# Patient Record
Sex: Female | Born: 1960 | Race: Black or African American | Hispanic: No | Marital: Single | Smoking: Never smoker
Health system: Southern US, Community
[De-identification: ages and names within clinical notes are randomized; demographics above are authoritative.]

## PROBLEM LIST (undated history)

## (undated) DIAGNOSIS — R739 Hyperglycemia, unspecified: Secondary | ICD-10-CM

## (undated) DIAGNOSIS — E669 Obesity, unspecified: Secondary | ICD-10-CM

## (undated) DIAGNOSIS — J329 Chronic sinusitis, unspecified: Secondary | ICD-10-CM

## (undated) DIAGNOSIS — J309 Allergic rhinitis, unspecified: Secondary | ICD-10-CM

## (undated) DIAGNOSIS — R3129 Other microscopic hematuria: Secondary | ICD-10-CM

## (undated) DIAGNOSIS — J45909 Unspecified asthma, uncomplicated: Secondary | ICD-10-CM

## (undated) DIAGNOSIS — F32A Depression, unspecified: Secondary | ICD-10-CM

## (undated) DIAGNOSIS — N6001 Solitary cyst of right breast: Secondary | ICD-10-CM

## (undated) DIAGNOSIS — T7840XA Allergy, unspecified, initial encounter: Secondary | ICD-10-CM

## (undated) DIAGNOSIS — B351 Tinea unguium: Secondary | ICD-10-CM

## (undated) DIAGNOSIS — M545 Low back pain, unspecified: Secondary | ICD-10-CM

## (undated) DIAGNOSIS — E039 Hypothyroidism, unspecified: Secondary | ICD-10-CM

## (undated) DIAGNOSIS — K219 Gastro-esophageal reflux disease without esophagitis: Secondary | ICD-10-CM

## (undated) DIAGNOSIS — D259 Leiomyoma of uterus, unspecified: Secondary | ICD-10-CM

## (undated) HISTORY — DX: Allergy, unspecified, initial encounter: T78.40XA

## (undated) HISTORY — DX: Other microscopic hematuria: R31.29

## (undated) HISTORY — DX: Depression, unspecified: F32.A

## (undated) HISTORY — DX: Low back pain, unspecified: M54.50

## (undated) HISTORY — DX: Chronic sinusitis, unspecified: J32.9

## (undated) HISTORY — PX: TONSILLECTOMY: SUR1361

## (undated) HISTORY — DX: Unspecified asthma, uncomplicated: J45.909

## (undated) HISTORY — DX: Gastro-esophageal reflux disease without esophagitis: K21.9

## (undated) HISTORY — DX: Leiomyoma of uterus, unspecified: D25.9

## (undated) HISTORY — DX: Obesity, unspecified: E66.9

## (undated) HISTORY — DX: Hypothyroidism, unspecified: E03.9

## (undated) HISTORY — PX: HERNIA REPAIR: SHX51

## (undated) HISTORY — DX: Solitary cyst of right breast: N60.01

## (undated) HISTORY — DX: Tinea unguium: B35.1

## (undated) HISTORY — DX: Hyperglycemia, unspecified: R73.9

## (undated) HISTORY — DX: Allergic rhinitis, unspecified: J30.9

---

## 1998-08-21 ENCOUNTER — Emergency Department (HOSPITAL_COMMUNITY): Admission: EM | Admit: 1998-08-21 | Discharge: 1998-08-21 | Payer: Self-pay | Admitting: Emergency Medicine

## 1998-08-21 ENCOUNTER — Encounter: Payer: Self-pay | Admitting: Emergency Medicine

## 1998-12-23 ENCOUNTER — Emergency Department (HOSPITAL_COMMUNITY): Admission: EM | Admit: 1998-12-23 | Discharge: 1998-12-23 | Payer: Self-pay | Admitting: Emergency Medicine

## 1999-01-14 ENCOUNTER — Emergency Department (HOSPITAL_COMMUNITY): Admission: EM | Admit: 1999-01-14 | Discharge: 1999-01-14 | Payer: Self-pay | Admitting: Emergency Medicine

## 1999-01-14 ENCOUNTER — Encounter: Payer: Self-pay | Admitting: Emergency Medicine

## 2000-05-08 ENCOUNTER — Emergency Department (HOSPITAL_COMMUNITY): Admission: EM | Admit: 2000-05-08 | Discharge: 2000-05-08 | Payer: Self-pay | Admitting: *Deleted

## 2000-05-09 ENCOUNTER — Emergency Department (HOSPITAL_COMMUNITY): Admission: EM | Admit: 2000-05-09 | Discharge: 2000-05-09 | Payer: Self-pay | Admitting: Emergency Medicine

## 2000-12-18 ENCOUNTER — Ambulatory Visit (HOSPITAL_COMMUNITY): Admission: RE | Admit: 2000-12-18 | Discharge: 2000-12-18 | Payer: Self-pay | Admitting: Internal Medicine

## 2000-12-18 ENCOUNTER — Encounter: Payer: Self-pay | Admitting: Internal Medicine

## 2001-09-03 ENCOUNTER — Other Ambulatory Visit: Admission: RE | Admit: 2001-09-03 | Discharge: 2001-09-03 | Payer: Self-pay | Admitting: Obstetrics and Gynecology

## 2002-07-13 ENCOUNTER — Encounter: Admission: RE | Admit: 2002-07-13 | Discharge: 2002-10-11 | Payer: Self-pay | Admitting: Internal Medicine

## 2002-09-17 ENCOUNTER — Other Ambulatory Visit: Admission: RE | Admit: 2002-09-17 | Discharge: 2002-09-17 | Payer: Self-pay | Admitting: Obstetrics and Gynecology

## 2002-09-30 ENCOUNTER — Other Ambulatory Visit: Admission: RE | Admit: 2002-09-30 | Discharge: 2002-09-30 | Payer: Self-pay | Admitting: Obstetrics and Gynecology

## 2002-10-07 ENCOUNTER — Encounter: Payer: Self-pay | Admitting: Internal Medicine

## 2002-10-07 ENCOUNTER — Ambulatory Visit (HOSPITAL_COMMUNITY): Admission: RE | Admit: 2002-10-07 | Discharge: 2002-10-07 | Payer: Self-pay | Admitting: Internal Medicine

## 2002-10-12 ENCOUNTER — Encounter: Admission: RE | Admit: 2002-10-12 | Discharge: 2002-10-12 | Payer: Self-pay | Admitting: Internal Medicine

## 2002-10-12 ENCOUNTER — Encounter: Payer: Self-pay | Admitting: Internal Medicine

## 2003-10-22 ENCOUNTER — Emergency Department (HOSPITAL_COMMUNITY): Admission: AD | Admit: 2003-10-22 | Discharge: 2003-10-22 | Payer: Self-pay | Admitting: Family Medicine

## 2003-12-01 ENCOUNTER — Encounter: Admission: RE | Admit: 2003-12-01 | Discharge: 2003-12-01 | Payer: Self-pay | Admitting: *Deleted

## 2004-01-10 ENCOUNTER — Encounter: Admission: RE | Admit: 2004-01-10 | Discharge: 2004-01-10 | Payer: Self-pay | Admitting: Internal Medicine

## 2004-09-12 ENCOUNTER — Ambulatory Visit: Payer: Self-pay | Admitting: Gastroenterology

## 2004-09-19 ENCOUNTER — Ambulatory Visit: Payer: Self-pay | Admitting: Gastroenterology

## 2004-10-09 ENCOUNTER — Ambulatory Visit: Payer: Self-pay | Admitting: Gastroenterology

## 2004-10-10 ENCOUNTER — Ambulatory Visit (HOSPITAL_COMMUNITY): Admission: RE | Admit: 2004-10-10 | Discharge: 2004-10-10 | Payer: Self-pay | Admitting: Gastroenterology

## 2004-10-31 ENCOUNTER — Ambulatory Visit: Payer: Self-pay | Admitting: Gastroenterology

## 2006-02-05 ENCOUNTER — Encounter: Admission: RE | Admit: 2006-02-05 | Discharge: 2006-02-05 | Payer: Self-pay | Admitting: Internal Medicine

## 2007-04-18 ENCOUNTER — Emergency Department (HOSPITAL_COMMUNITY): Admission: EM | Admit: 2007-04-18 | Discharge: 2007-04-18 | Payer: Self-pay | Admitting: Emergency Medicine

## 2007-04-24 ENCOUNTER — Encounter: Admission: RE | Admit: 2007-04-24 | Discharge: 2007-05-15 | Payer: Self-pay | Admitting: Internal Medicine

## 2007-09-09 ENCOUNTER — Ambulatory Visit (HOSPITAL_COMMUNITY): Admission: RE | Admit: 2007-09-09 | Discharge: 2007-09-09 | Payer: Self-pay | Admitting: Internal Medicine

## 2009-05-18 ENCOUNTER — Ambulatory Visit (HOSPITAL_COMMUNITY): Admission: RE | Admit: 2009-05-18 | Discharge: 2009-05-18 | Payer: Self-pay | Admitting: Internal Medicine

## 2009-08-19 ENCOUNTER — Encounter (INDEPENDENT_AMBULATORY_CARE_PROVIDER_SITE_OTHER): Payer: Self-pay | Admitting: *Deleted

## 2010-01-23 ENCOUNTER — Encounter (INDEPENDENT_AMBULATORY_CARE_PROVIDER_SITE_OTHER): Payer: Self-pay | Admitting: *Deleted

## 2010-04-19 ENCOUNTER — Telehealth: Payer: Self-pay | Admitting: Gastroenterology

## 2010-05-22 ENCOUNTER — Encounter (INDEPENDENT_AMBULATORY_CARE_PROVIDER_SITE_OTHER): Payer: Self-pay | Admitting: *Deleted

## 2010-06-02 ENCOUNTER — Other Ambulatory Visit: Admission: RE | Admit: 2010-06-02 | Discharge: 2010-06-02 | Payer: Self-pay | Admitting: Obstetrics and Gynecology

## 2010-06-02 ENCOUNTER — Other Ambulatory Visit
Admission: RE | Admit: 2010-06-02 | Discharge: 2010-06-02 | Payer: Self-pay | Source: Home / Self Care | Admitting: Obstetrics and Gynecology

## 2010-08-03 ENCOUNTER — Other Ambulatory Visit (HOSPITAL_COMMUNITY): Payer: Self-pay | Admitting: Obstetrics and Gynecology

## 2010-08-03 DIAGNOSIS — Z0000 Encounter for general adult medical examination without abnormal findings: Secondary | ICD-10-CM

## 2010-08-05 ENCOUNTER — Encounter: Payer: Self-pay | Admitting: Internal Medicine

## 2010-08-06 ENCOUNTER — Encounter: Payer: Self-pay | Admitting: Internal Medicine

## 2010-08-07 ENCOUNTER — Encounter: Payer: Self-pay | Admitting: Obstetrics and Gynecology

## 2010-08-15 NOTE — Progress Notes (Signed)
Summary: Schedule Colonoscopy  Phone Note Outgoing Call Call back at Home Phone [phone]   Call placed by: Harlow Mares CMA Duncan Dull),  April 19, 2010 10:58 AM Call placed to: Patient Summary of Call: patient had her colonoscopy scheduled and cxed she is still due and for her colonoscopy. I have Left a message on patients machine to call back.  Initial call taken by: Harlow Mares CMA Duncan Dull),  April 19, 2010 10:59 AM  Follow-up for Phone Call        No Answer, we will mail the patient a letter top remind her she is over due for her colonoscopy Follow-up by: Harlow Mares CMA Duncan Dull),  April 28, 2010 11:18 AM

## 2010-08-15 NOTE — Letter (Signed)
Summary: Pre Visit Letter Revised  [facility]  [address] [place], [place] [address]   Phone: [phone]  Fax: [phone]        05/22/2010 MRN: [id number] [facility] [address] [place], [place]  [address]             Procedure Date:  06-28-10   Welcome to the Gastroenterology Division at [facility].    You are scheduled to see a nurse for your pre-procedure visit on 06-12-10 at 3:30p.m. on the 3rd floor at [facility], [address].  We ask that you try to arrive at our office 15 minutes prior to your appointment time to allow for check-in.  Please take a minute to review the attached form.  If you answer "Yes" to one or more of the questions on the first page, we ask that you call the person listed at your earliest opportunity.  If you answer "No" to all of the questions, please complete the rest of the form and bring it to your appointment.    Your nurse visit will consist of discussing your medical and surgical history, your immediate family medical history, and your medications.   If you are unable to list all of your medications on the form, please bring the medication bottles to your appointment and we will list them.  We will need to be aware of both prescribed and over the counter drugs.  We will need to know exact dosage information as well.    Please be prepared to read and sign documents such as consent forms, a financial agreement, and acknowledgement forms.  If necessary, and with your consent, a friend or relative is welcome to sit-in on the nurse visit with you.  Please bring your insurance card so that we may make a copy of it.  If your insurance requires a referral to see a specialist, please bring your referral form from your primary care physician.  No co-pay is required for this nurse visit.     If you cannot keep your appointment, please call [phone] to cancel or reschedule prior to your appointment date.  This allows  [place] the opportunity to schedule an appointment for another patient in need of care.    Thank you for choosing [facility] for your medical needs.  We appreciate the opportunity to care for you.  Please visit [place] at our website  to learn more about our practice.  Sincerely, The Gastroenterology Division

## 2010-08-15 NOTE — Letter (Signed)
Summary: Colonoscopy Letter  [facility]  [address] [place], [place] [address]   Phone: [phone]  Fax: [phone]      August 19, 2009 MRN: [id number]   [facility] [address] [place], [place]  [address]   Dear Ms. BLOXOM,   According to your medical record, it is time for you to schedule a Colonoscopy. The American Cancer Society recommends this procedure as a method to detect early colon cancer. Patients with a family history of colon cancer, or a personal history of colon polyps or inflammatory bowel disease are at increased risk.  This letter has beeen generated based on the recommendations made at the time of your procedure. If you feel that in your particular situation this may no longer apply, please contact our office.  Please call our office at [phone] to schedule this appointment or to update your records at your earliest convenience.  Thank you for cooperating with [place] to provide you with the very best care possible.   Sincerely,  Vania Rea. Jarold Motto, M.D.  [facility] Gastroenterology Division [phone]

## 2010-08-15 NOTE — Letter (Signed)
Summary: Previsit letter  [facility]  [address] [place], [place] [address]   Phone: [phone]  Fax: [phone]       01/23/2010 MRN: [id number]  [facility] [address] [place], [place]  [address]  Dear Paula Lee,  Welcome to the Gastroenterology Division at [facility].    You are scheduled to see a nurse for your pre-procedure visit on 03-28-10 at 8:00a.m. on the 3rd floor at [facility], [address].  We ask that you try to arrive at our office 15 minutes prior to your appointment time to allow for check-in.  Your nurse visit will consist of discussing your medical and surgical history, your immediate family medical history, and your medications.    Please bring a complete list of all your medications or, if you prefer, bring the medication bottles and we will list them.  We will need to be aware of both prescribed and over the counter drugs.  We will need to know exact dosage information as well.  If you are on blood thinners (Coumadin, Plavix, Aggrenox, Ticlid, etc.) please call our office today/prior to your appointment, as we need to consult with your physician about holding your medication.   Please be prepared to read and sign documents such as consent forms, a financial agreement, and acknowledgement forms.  If necessary, and with your consent, a friend or relative is welcome to sit-in on the nurse visit with you.  Please bring your insurance card so that we may make a copy of it.  If your insurance requires a referral to see a specialist, please bring your referral form from your primary care physician.  No co-pay is required for this nurse visit.     If you cannot keep your appointment, please call [phone] to cancel or reschedule prior to your appointment date.  This allows [place] the opportunity to schedule an appointment for another patient in need of care.    Thank you for choosing [facility] for your medical needs.   We appreciate the opportunity to care for you.  Please visit [place] at our website  to learn more about our practice.                     Sincerely.                                                                                                                   The Gastroenterology Division

## 2010-08-21 ENCOUNTER — Ambulatory Visit (HOSPITAL_COMMUNITY): Admission: RE | Admit: 2010-08-21 | Payer: Self-pay | Source: Home / Self Care | Admitting: Obstetrics and Gynecology

## 2010-08-21 ENCOUNTER — Ambulatory Visit (HOSPITAL_COMMUNITY)
Admission: RE | Admit: 2010-08-21 | Discharge: 2010-08-21 | Disposition: A | Discharge status: Home / Self Care | Payer: BC Managed Care – PPO | Source: Ambulatory Visit | Attending: Obstetrics and Gynecology | Admitting: Obstetrics and Gynecology

## 2010-08-21 DIAGNOSIS — Z1231 Encounter for screening mammogram for malignant neoplasm of breast: Secondary | ICD-10-CM | POA: Insufficient documentation

## 2010-08-21 DIAGNOSIS — Z0000 Encounter for general adult medical examination without abnormal findings: Secondary | ICD-10-CM

## 2011-10-15 ENCOUNTER — Encounter: Payer: Self-pay | Admitting: Gastroenterology

## 2012-01-08 ENCOUNTER — Other Ambulatory Visit (HOSPITAL_COMMUNITY): Payer: Self-pay | Admitting: Internal Medicine

## 2012-01-08 DIAGNOSIS — Z1231 Encounter for screening mammogram for malignant neoplasm of breast: Secondary | ICD-10-CM

## 2012-01-31 ENCOUNTER — Ambulatory Visit (HOSPITAL_COMMUNITY): Payer: BC Managed Care – PPO

## 2012-03-14 DIAGNOSIS — R87810 Cervical high risk human papillomavirus (HPV) DNA test positive: Secondary | ICD-10-CM | POA: Insufficient documentation

## 2012-03-14 DIAGNOSIS — D251 Intramural leiomyoma of uterus: Secondary | ICD-10-CM | POA: Insufficient documentation

## 2012-04-01 ENCOUNTER — Other Ambulatory Visit (HOSPITAL_COMMUNITY): Payer: Self-pay | Admitting: Internal Medicine

## 2012-04-01 DIAGNOSIS — Z1231 Encounter for screening mammogram for malignant neoplasm of breast: Secondary | ICD-10-CM

## 2012-05-28 ENCOUNTER — Ambulatory Visit (HOSPITAL_COMMUNITY)
Admission: RE | Admit: 2012-05-28 | Discharge: 2012-05-28 | Disposition: A | Discharge status: Home / Self Care | Payer: BC Managed Care – PPO | Source: Ambulatory Visit | Attending: Internal Medicine | Admitting: Internal Medicine

## 2012-05-28 DIAGNOSIS — Z1231 Encounter for screening mammogram for malignant neoplasm of breast: Secondary | ICD-10-CM

## 2012-06-02 ENCOUNTER — Other Ambulatory Visit: Payer: Self-pay | Admitting: Internal Medicine

## 2012-06-02 DIAGNOSIS — R928 Other abnormal and inconclusive findings on diagnostic imaging of breast: Secondary | ICD-10-CM

## 2012-06-09 ENCOUNTER — Ambulatory Visit
Admission: RE | Admit: 2012-06-09 | Discharge: 2012-06-09 | Disposition: A | Discharge status: Home / Self Care | Payer: BC Managed Care – PPO | Source: Ambulatory Visit | Attending: Internal Medicine | Admitting: Internal Medicine

## 2012-06-09 DIAGNOSIS — R928 Other abnormal and inconclusive findings on diagnostic imaging of breast: Secondary | ICD-10-CM

## 2013-03-25 ENCOUNTER — Other Ambulatory Visit (HOSPITAL_COMMUNITY): Payer: Self-pay | Admitting: Internal Medicine

## 2013-03-25 DIAGNOSIS — Z1231 Encounter for screening mammogram for malignant neoplasm of breast: Secondary | ICD-10-CM

## 2013-04-17 ENCOUNTER — Ambulatory Visit (HOSPITAL_COMMUNITY)
Admission: RE | Admit: 2013-04-17 | Discharge: 2013-04-17 | Disposition: A | Discharge status: Home / Self Care | Payer: BC Managed Care – PPO | Source: Ambulatory Visit | Attending: Internal Medicine | Admitting: Internal Medicine

## 2013-04-17 ENCOUNTER — Other Ambulatory Visit (HOSPITAL_COMMUNITY): Payer: Self-pay | Admitting: Internal Medicine

## 2013-04-17 DIAGNOSIS — Z0000 Encounter for general adult medical examination without abnormal findings: Secondary | ICD-10-CM

## 2013-04-17 DIAGNOSIS — Z1231 Encounter for screening mammogram for malignant neoplasm of breast: Secondary | ICD-10-CM

## 2013-05-25 DIAGNOSIS — Z0000 Encounter for general adult medical examination without abnormal findings: Secondary | ICD-10-CM | POA: Insufficient documentation

## 2013-05-25 DIAGNOSIS — N951 Menopausal and female climacteric states: Secondary | ICD-10-CM | POA: Insufficient documentation

## 2013-05-27 ENCOUNTER — Encounter: Payer: Self-pay | Admitting: Gastroenterology

## 2013-06-15 ENCOUNTER — Ambulatory Visit (HOSPITAL_COMMUNITY)
Admission: RE | Admit: 2013-06-15 | Discharge: 2013-06-15 | Disposition: A | Discharge status: Home / Self Care | Payer: BC Managed Care – PPO | Source: Ambulatory Visit | Attending: Internal Medicine | Admitting: Internal Medicine

## 2013-06-15 DIAGNOSIS — Z1231 Encounter for screening mammogram for malignant neoplasm of breast: Secondary | ICD-10-CM | POA: Insufficient documentation

## 2013-06-15 DIAGNOSIS — Z0000 Encounter for general adult medical examination without abnormal findings: Secondary | ICD-10-CM

## 2014-02-05 DIAGNOSIS — Z803 Family history of malignant neoplasm of breast: Secondary | ICD-10-CM | POA: Insufficient documentation

## 2014-05-21 DIAGNOSIS — N3943 Post-void dribbling: Secondary | ICD-10-CM | POA: Insufficient documentation

## 2014-05-25 ENCOUNTER — Other Ambulatory Visit: Payer: Self-pay

## 2014-05-25 DIAGNOSIS — Z1231 Encounter for screening mammogram for malignant neoplasm of breast: Secondary | ICD-10-CM

## 2014-07-12 ENCOUNTER — Other Ambulatory Visit (HOSPITAL_COMMUNITY): Payer: Self-pay | Admitting: Obstetrics and Gynecology

## 2014-07-12 DIAGNOSIS — Z1231 Encounter for screening mammogram for malignant neoplasm of breast: Secondary | ICD-10-CM

## 2014-07-28 ENCOUNTER — Ambulatory Visit (HOSPITAL_COMMUNITY): Payer: Self-pay

## 2014-08-16 ENCOUNTER — Other Ambulatory Visit (HOSPITAL_COMMUNITY): Payer: Self-pay | Admitting: Internal Medicine

## 2014-08-16 DIAGNOSIS — Z1231 Encounter for screening mammogram for malignant neoplasm of breast: Secondary | ICD-10-CM

## 2014-08-18 ENCOUNTER — Other Ambulatory Visit: Payer: Self-pay | Admitting: Internal Medicine

## 2014-08-18 ENCOUNTER — Other Ambulatory Visit: Payer: Self-pay

## 2014-08-18 ENCOUNTER — Ambulatory Visit (HOSPITAL_COMMUNITY): Payer: BC Managed Care – PPO

## 2014-08-18 DIAGNOSIS — Z1231 Encounter for screening mammogram for malignant neoplasm of breast: Secondary | ICD-10-CM

## 2014-08-19 ENCOUNTER — Encounter (INDEPENDENT_AMBULATORY_CARE_PROVIDER_SITE_OTHER): Payer: Self-pay

## 2014-08-19 ENCOUNTER — Ambulatory Visit
Admission: RE | Admit: 2014-08-19 | Discharge: 2014-08-19 | Disposition: A | Discharge status: Home / Self Care | Payer: BC Managed Care – PPO | Source: Ambulatory Visit | Attending: Internal Medicine | Admitting: Internal Medicine

## 2014-08-19 ENCOUNTER — Ambulatory Visit (HOSPITAL_COMMUNITY): Payer: Self-pay

## 2014-08-19 DIAGNOSIS — Z1231 Encounter for screening mammogram for malignant neoplasm of breast: Secondary | ICD-10-CM

## 2014-08-20 ENCOUNTER — Ambulatory Visit (HOSPITAL_COMMUNITY): Payer: BC Managed Care – PPO

## 2014-08-24 ENCOUNTER — Ambulatory Visit: Payer: Self-pay | Admitting: Podiatry

## 2014-09-17 ENCOUNTER — Ambulatory Visit: Payer: Self-pay | Admitting: Podiatry

## 2015-05-02 DIAGNOSIS — Z8371 Family history of colonic polyps: Secondary | ICD-10-CM | POA: Insufficient documentation

## 2015-08-16 ENCOUNTER — Other Ambulatory Visit: Payer: Self-pay

## 2015-08-16 DIAGNOSIS — Z1231 Encounter for screening mammogram for malignant neoplasm of breast: Secondary | ICD-10-CM

## 2015-08-24 ENCOUNTER — Ambulatory Visit
Admission: RE | Admit: 2015-08-24 | Discharge: 2015-08-24 | Disposition: A | Discharge status: Home / Self Care | Payer: BC Managed Care – PPO | Source: Ambulatory Visit

## 2015-08-24 DIAGNOSIS — Z1231 Encounter for screening mammogram for malignant neoplasm of breast: Secondary | ICD-10-CM

## 2015-11-11 ENCOUNTER — Ambulatory Visit (INDEPENDENT_AMBULATORY_CARE_PROVIDER_SITE_OTHER): Payer: BC Managed Care – PPO | Admitting: Family Medicine

## 2015-11-11 VITALS — BP 114/66 | HR 61 | Temp 98.3°F | Resp 18 | Ht 67.0 in | Wt 190.6 lb

## 2015-11-11 DIAGNOSIS — K219 Gastro-esophageal reflux disease without esophagitis: Secondary | ICD-10-CM

## 2015-11-11 DIAGNOSIS — E039 Hypothyroidism, unspecified: Secondary | ICD-10-CM

## 2015-11-11 DIAGNOSIS — J302 Other seasonal allergic rhinitis: Secondary | ICD-10-CM | POA: Diagnosis not present

## 2015-11-11 DIAGNOSIS — R21 Rash and other nonspecific skin eruption: Secondary | ICD-10-CM | POA: Diagnosis not present

## 2015-11-11 MED ORDER — CLOTRIMAZOLE-BETAMETHASONE 1-0.05 % EX CREA: 1.0000 "application " | TOPICAL_CREAM | Freq: Two times a day (BID) | CUTANEOUS | Status: DC

## 2015-11-11 NOTE — Patient Instructions (Signed)
     IF you received an x-ray today, you will receive an invoice from [facility]. Please contact [facility] at [phone] with questions or concerns regarding your invoice.   IF you received labwork today, you will receive an invoice from [facility]. Please contact [facility] at [phone] with questions or concerns regarding your invoice.   Our billing staff will not be able to assist you with questions regarding bills from these companies.  You will be contacted with the lab results as soon as they are available. The fastest way to get your results is to activate your My Chart account. Instructions are located on the last page of this paperwork. If you have not heard from us regarding the results in 2 weeks, please contact this office.      

## 2015-11-11 NOTE — Progress Notes (Signed)
This is a 55 year old woman who is retired from working for the state for 30 years. She comes in with a bilateral itchy breast rash which is located in the cleavage and around the areola. She's had for several days. She's not had this before and she has no pain in her breasts. She's noticed no masses or nodules. She also denies nipple discharge.  Objective: Patient has a fine maculopapular rash in the areas mentioned above. BP 114/66 mmHg  Pulse 61  Temp(Src) 98.3 F (36.8 C) (Oral)  Resp 18  Ht 5\' 7"  (1.702 m)  Wt 190 lb 9.6 oz (86.456 kg)  BMI 29.85 kg/m2  SpO2 99% Assessment: I suspect this is a tinea infection.  Rash and nonspecific skin eruption - Plan: clotrimazole-betamethasone (LOTRISONE) cream  Hypothyroidism, unspecified hypothyroidism type  Seasonal allergies  Gastroesophageal reflux disease, esophagitis presence not specified  Robyn Haber M.D..

## 2016-06-19 ENCOUNTER — Other Ambulatory Visit: Payer: Self-pay | Admitting: Internal Medicine

## 2016-06-19 DIAGNOSIS — Z1231 Encounter for screening mammogram for malignant neoplasm of breast: Secondary | ICD-10-CM

## 2016-07-16 HISTORY — PX: BREAST BIOPSY: SHX20

## 2016-08-28 ENCOUNTER — Ambulatory Visit: Payer: Self-pay

## 2016-10-10 ENCOUNTER — Ambulatory Visit: Payer: Self-pay

## 2016-12-13 ENCOUNTER — Encounter: Payer: Self-pay | Admitting: Radiology

## 2016-12-13 ENCOUNTER — Ambulatory Visit
Admission: RE | Admit: 2016-12-13 | Discharge: 2016-12-13 | Disposition: A | Discharge status: Home / Self Care | Payer: BC Managed Care – PPO | Source: Ambulatory Visit | Attending: Internal Medicine | Admitting: Internal Medicine

## 2016-12-13 DIAGNOSIS — Z1231 Encounter for screening mammogram for malignant neoplasm of breast: Secondary | ICD-10-CM

## 2016-12-17 ENCOUNTER — Other Ambulatory Visit: Payer: Self-pay | Admitting: Internal Medicine

## 2016-12-17 DIAGNOSIS — R928 Other abnormal and inconclusive findings on diagnostic imaging of breast: Secondary | ICD-10-CM

## 2016-12-20 ENCOUNTER — Ambulatory Visit
Admission: RE | Admit: 2016-12-20 | Discharge: 2016-12-20 | Disposition: A | Discharge status: Home / Self Care | Payer: BC Managed Care – PPO | Source: Ambulatory Visit | Attending: Internal Medicine | Admitting: Internal Medicine

## 2016-12-20 DIAGNOSIS — R928 Other abnormal and inconclusive findings on diagnostic imaging of breast: Secondary | ICD-10-CM

## 2016-12-22 ENCOUNTER — Encounter (HOSPITAL_COMMUNITY): Payer: Self-pay | Admitting: Nurse Practitioner

## 2016-12-22 DIAGNOSIS — T7840XA Allergy, unspecified, initial encounter: Secondary | ICD-10-CM | POA: Insufficient documentation

## 2016-12-22 DIAGNOSIS — J45909 Unspecified asthma, uncomplicated: Secondary | ICD-10-CM | POA: Insufficient documentation

## 2016-12-22 DIAGNOSIS — R0602 Shortness of breath: Secondary | ICD-10-CM | POA: Diagnosis present

## 2016-12-22 DIAGNOSIS — Z79899 Other long term (current) drug therapy: Secondary | ICD-10-CM | POA: Diagnosis not present

## 2016-12-22 NOTE — ED Triage Notes (Signed)
Patient stated she was coloring hair and about 30 min. After started feeling short of breath and neck pain. States she has used this coloring before. She reports having hx of GERD and wasn't sure if that was what is going on so she took a nexium prior to coming in. Also reports having a sinus infection this pass week and receiving a cortisone injection by PCP. Able to ambulate without difficulty, no apparent physical distress. Able to talk in complete sentences without difficulty or shortness of breath.

## 2016-12-23 ENCOUNTER — Emergency Department (HOSPITAL_COMMUNITY)
Admission: EM | Admit: 2016-12-23 | Discharge: 2016-12-23 | Disposition: A | Discharge status: Home / Self Care | Payer: BC Managed Care – PPO | Attending: Emergency Medicine | Admitting: Emergency Medicine

## 2016-12-23 ENCOUNTER — Emergency Department (HOSPITAL_COMMUNITY): Payer: BC Managed Care – PPO

## 2016-12-23 DIAGNOSIS — T6591XA Toxic effect of unspecified substance, accidental (unintentional), initial encounter: Secondary | ICD-10-CM

## 2016-12-23 MED ORDER — FAMOTIDINE 20 MG PO TABS: 20.0000 mg | ORAL_TABLET | Freq: Two times a day (BID) | ORAL | 0 refills | Status: DC

## 2016-12-23 MED ORDER — FAMOTIDINE 20 MG PO TABS
20.0000 mg | ORAL_TABLET | Freq: Once | ORAL | Status: AC
  Administered 2016-12-23: 20 mg via ORAL
  Filled 2016-12-23: qty 1

## 2016-12-23 MED ORDER — PREDNISONE 20 MG PO TABS
60.0000 mg | ORAL_TABLET | Freq: Once | ORAL | Status: AC
  Administered 2016-12-23: 60 mg via ORAL
  Filled 2016-12-23: qty 3

## 2016-12-23 MED ORDER — DIPHENHYDRAMINE HCL 25 MG PO CAPS: 25.0000 mg | ORAL_CAPSULE | Freq: Once | ORAL | Status: DC

## 2016-12-23 MED ORDER — PREDNISONE 50 MG PO TABS: 50.0000 mg | ORAL_TABLET | Freq: Every day | ORAL | 0 refills | Status: DC

## 2016-12-23 NOTE — Discharge Instructions (Signed)
Return here as needed. Follow up with your doctor for a recheck. °

## 2016-12-23 NOTE — ED Provider Notes (Signed)
[facility] DEPT Provider Note   CSN: [id number] Arrival date & time: 12/22/16  2259     History   Chief Complaint Chief Complaint  Patient presents with  . Shortness of Breath    HPI Paula Lee is a 56 y.o. female.  HPI Patient presents to the emergency department with scalp itching and irritation of her throat after using hair dye.  Patient states that this occurred around 5:00 PM she states that she still feels like she has some tickling in her throat.  Patient states she is feeling itching in her scalp as well.  Patient did not take any medications prior to arrival for her symptoms.  Nothing seems make the condition better or worse.  The patient states that she has never had a reaction to this particular dye in the pastThe patient denies chest pain, shortness of breath, headache,blurred vision, neck pain, fever, cough, weakness, numbness, dizziness, anorexia, edema, abdominal pain, nausea, vomiting, diarrhea, rash, back pain, dysuria, hematemesis, bloody stool, near syncope, or syncope. Past Medical History:  Diagnosis Date  . Allergy   . Asthma     Patient Active Problem List   Diagnosis Date Noted  . Thyroid activity decreased 11/11/2015  . Seasonal allergies 11/11/2015  . Esophageal reflux 11/11/2015    Past Surgical History:  Procedure Laterality Date  . HERNIA REPAIR    . TONSILLECTOMY      OB History    No data available       Home Medications    Prior to Admission medications   Medication Sig Start Date End Date Taking? Authorizing Provider  cetirizine (ZYRTEC) 10 MG tablet Take 10 mg by mouth daily as needed for allergies.   Yes [provider]  cholecalciferol (VITAMIN D) 1000 units tablet Take 1,000 Units by mouth daily.   Yes [provider]  esomeprazole (NEXIUM) 20 MG capsule Take 20 mg by mouth daily at 12 noon.   Yes [provider]  fluticasone (FLONASE) 50 MCG/ACT nasal spray Place 2 sprays into both nostrils  daily as needed for allergies or rhinitis.   Yes [provider]  levothyroxine (SYNTHROID, LEVOTHROID) 75 MCG tablet Take 75 mcg by mouth daily before breakfast.   Yes [provider]  vitamin E 400 UNIT capsule Take 400 Units by mouth daily.   Yes [provider]  clotrimazole-betamethasone (LOTRISONE) cream Apply 1 application topically 2 (two) times daily. Patient not taking: Reported on 12/23/2016 11/11/15   Robyn Haber, MD    Family History Family History  Problem Relation Age of Onset  . Hypertension Mother   . Diabetes Mother   . Cancer Mother     Social History Social History  Substance Use Topics  . Smoking status: Never Smoker  . Smokeless tobacco: Never Used  . Alcohol use No     Allergies   Penicillins   Review of Systems Review of Systems  All other systems negative except as documented in the HPI. All pertinent positives and negatives as reviewed in the HPI. Physical Exam Updated Vital Signs BP (!) 148/67 (BP Location: Left Arm)   Pulse (!) 45   Temp 97.7 F (36.5 C) (Oral)   Resp 20   Ht 5\' 7"  (1.702 m)   Wt 87.1 kg (192 lb)   LMP 12/22/2016   SpO2 100%   BMI 30.07 kg/m   Physical Exam  Constitutional: She is oriented to person, place, and time. She appears well-developed and well-nourished. No distress.  HENT:  Head: Normocephalic and atraumatic.  Mouth/Throat: Oropharynx is clear and moist.  Eyes: Pupils are equal, round, and reactive to light.  Neck: Normal range of motion. Neck supple.  Cardiovascular: Normal rate, regular rhythm and normal heart sounds.  Exam reveals no gallop and no friction rub.   No murmur heard. Pulmonary/Chest: Effort normal and breath sounds normal. No respiratory distress. She has no wheezes.  Abdominal: Soft. Bowel sounds are normal. She exhibits no distension. There is no tenderness.  Neurological: She is alert and oriented to person, place, and time. She exhibits normal muscle tone.  Coordination normal.  Skin: Skin is warm and dry. Capillary refill takes less than 2 seconds. No rash noted. No erythema.  Psychiatric: She has a normal mood and affect. Her behavior is normal.  Nursing note and vitals reviewed.    ED Treatments / Results  Labs (all labs ordered are listed, but only abnormal results are displayed) Labs Reviewed - No data to display  EKG  EKG Interpretation None       Radiology Dg Chest 2 View  Result Date: 12/23/2016 CLINICAL DATA:  Shortness of breath. EXAM: CHEST  2 VIEW COMPARISON:  None. FINDINGS: The cardiomediastinal contours are normal. The lungs are clear. Pulmonary vasculature is normal. No consolidation, pleural effusion, or pneumothorax. No acute osseous abnormalities are seen. IMPRESSION: No active cardiopulmonary disease. Electronically Signed   By: Jeb Levering M.D.   On: 12/23/2016 02:46    Procedures Procedures (including critical care time)  Medications Ordered in ED Medications  diphenhydrAMINE (BENADRYL) capsule 25 mg (not administered)  predniSONE (DELTASONE) tablet 60 mg (60 mg Oral Given 12/23/16 0255)  famotidine (PEPCID) tablet 20 mg (20 mg Oral Given 12/23/16 0254)     Initial Impression / Assessment and Plan / ED Course  I have reviewed the triage vital signs and the nursing notes.  Pertinent labs & imaging results that were available during my care of the patient were reviewed by me and considered in my medical decision making (see chart for details).     Patient is given treatment for possible allergic reaction.  She will be advised follow-up with her primary doctor did advise her not to use the hair dye any further.  We will give her strict return precautions.  The patient is feeling resolution of her symptoms at this time Final Clinical Impressions(s) / ED Diagnoses   Final diagnoses:  None    New Prescriptions New Prescriptions   No medications on file     Dalia Heading, PA-C 12/23/16  [place]    Rolland Porter, MD 12/23/16 [phone]

## 2017-01-14 ENCOUNTER — Other Ambulatory Visit: Payer: Self-pay | Admitting: Internal Medicine

## 2017-01-14 DIAGNOSIS — N644 Mastodynia: Secondary | ICD-10-CM

## 2017-01-14 DIAGNOSIS — N63 Unspecified lump in unspecified breast: Secondary | ICD-10-CM

## 2017-01-18 ENCOUNTER — Ambulatory Visit
Admission: RE | Admit: 2017-01-18 | Discharge: 2017-01-18 | Disposition: A | Discharge status: Home / Self Care | Payer: BC Managed Care – PPO | Source: Ambulatory Visit | Attending: Internal Medicine | Admitting: Internal Medicine

## 2017-01-18 ENCOUNTER — Other Ambulatory Visit: Payer: Self-pay | Admitting: Internal Medicine

## 2017-01-18 DIAGNOSIS — N63 Unspecified lump in unspecified breast: Secondary | ICD-10-CM

## 2017-01-18 DIAGNOSIS — N644 Mastodynia: Secondary | ICD-10-CM

## 2017-01-22 ENCOUNTER — Other Ambulatory Visit: Payer: Self-pay

## 2017-01-23 ENCOUNTER — Ambulatory Visit
Admission: RE | Admit: 2017-01-23 | Discharge: 2017-01-23 | Disposition: A | Discharge status: Home / Self Care | Payer: BC Managed Care – PPO | Source: Ambulatory Visit | Attending: Internal Medicine | Admitting: Internal Medicine

## 2017-01-23 ENCOUNTER — Other Ambulatory Visit: Payer: Self-pay | Admitting: Internal Medicine

## 2017-01-23 DIAGNOSIS — N644 Mastodynia: Secondary | ICD-10-CM

## 2017-07-15 ENCOUNTER — Other Ambulatory Visit: Payer: Self-pay | Admitting: Internal Medicine

## 2017-07-15 DIAGNOSIS — D219 Benign neoplasm of connective and other soft tissue, unspecified: Secondary | ICD-10-CM

## 2017-07-19 ENCOUNTER — Other Ambulatory Visit: Payer: Self-pay

## 2017-11-20 ENCOUNTER — Other Ambulatory Visit: Payer: Self-pay

## 2017-12-02 ENCOUNTER — Other Ambulatory Visit: Payer: Self-pay

## 2017-12-12 ENCOUNTER — Other Ambulatory Visit: Payer: Self-pay | Admitting: Internal Medicine

## 2017-12-12 DIAGNOSIS — Z1231 Encounter for screening mammogram for malignant neoplasm of breast: Secondary | ICD-10-CM

## 2017-12-13 ENCOUNTER — Other Ambulatory Visit: Payer: Self-pay | Admitting: Internal Medicine

## 2017-12-13 ENCOUNTER — Ambulatory Visit
Admission: RE | Admit: 2017-12-13 | Discharge: 2017-12-13 | Disposition: A | Discharge status: Home / Self Care | Payer: BC Managed Care – PPO | Source: Ambulatory Visit | Attending: Internal Medicine | Admitting: Internal Medicine

## 2017-12-13 DIAGNOSIS — M545 Low back pain: Secondary | ICD-10-CM

## 2018-01-14 ENCOUNTER — Ambulatory Visit
Admission: RE | Admit: 2018-01-14 | Discharge: 2018-01-14 | Disposition: A | Discharge status: Home / Self Care | Payer: BC Managed Care – PPO | Source: Ambulatory Visit | Attending: Internal Medicine | Admitting: Internal Medicine

## 2018-01-14 DIAGNOSIS — Z1231 Encounter for screening mammogram for malignant neoplasm of breast: Secondary | ICD-10-CM

## 2018-03-02 IMAGING — CR DG CHEST 2V
2 series · 2 of 2 positions shown · non-contrast
Comparison: None.

CLINICAL DATA: Shortness of breath.

EXAM:
CHEST  2 VIEW

[w chest pa]
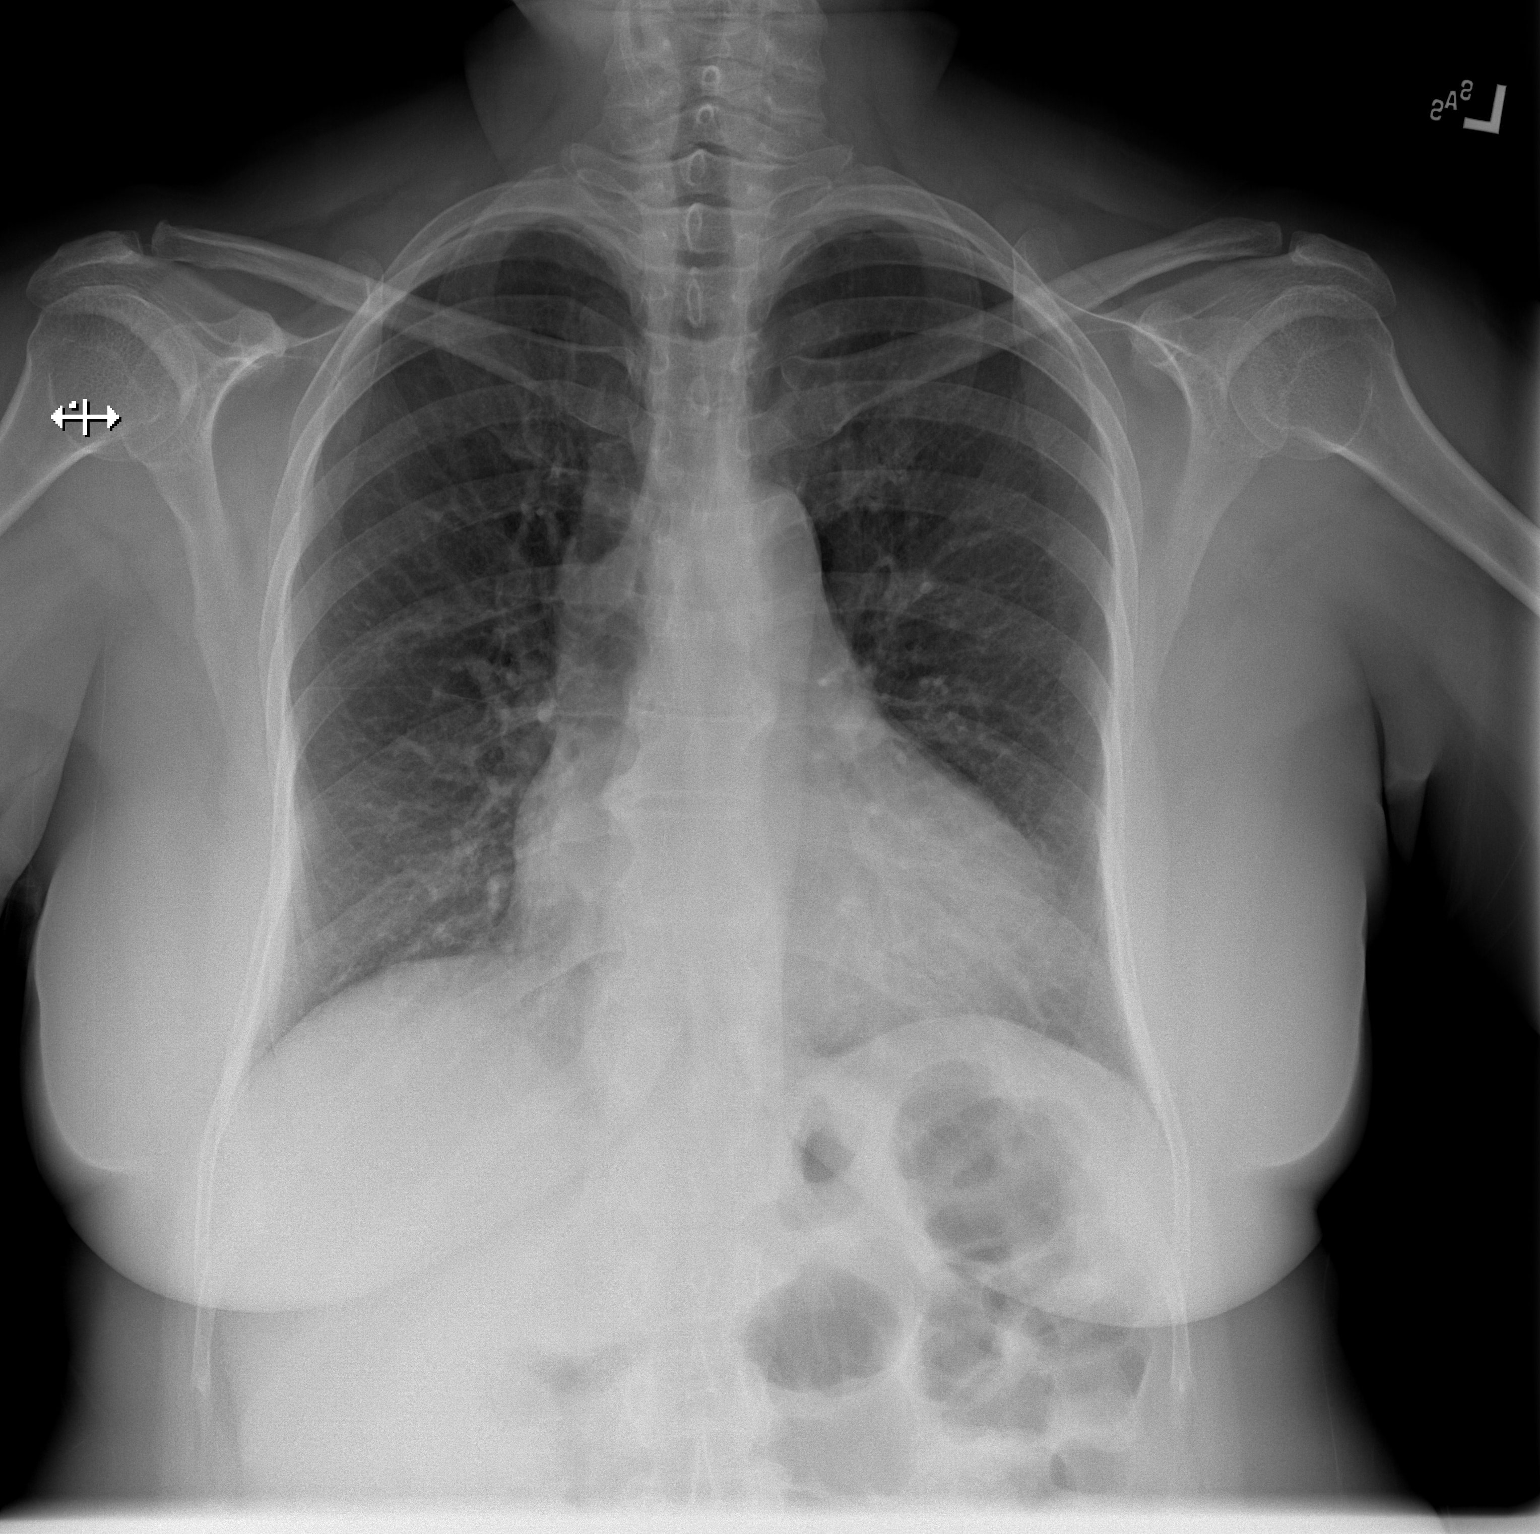

[w chest lat]
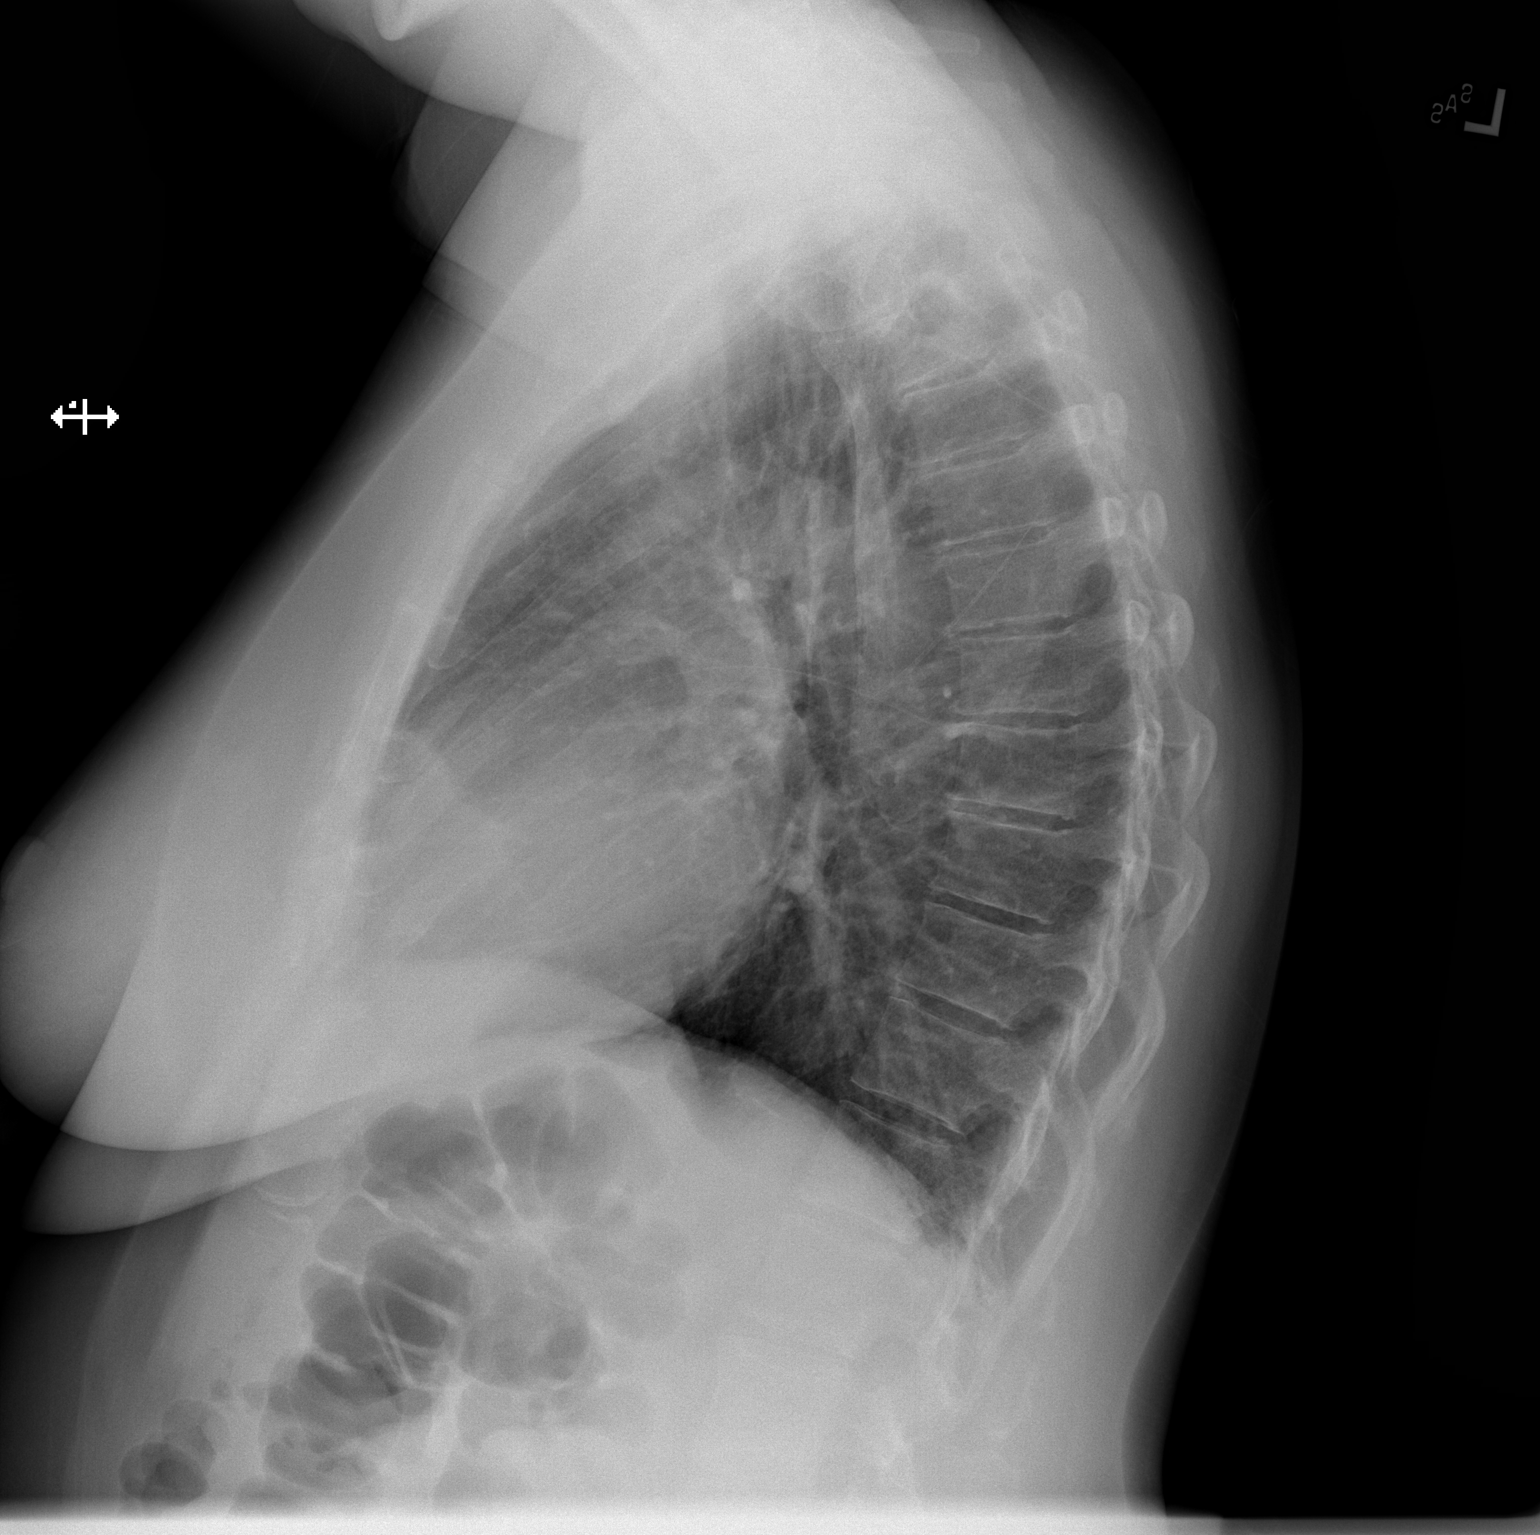

[2 of 2 positions shown; findings below may reference images not displayed]

FINDINGS: The cardiomediastinal contours are normal. The lungs are clear.
Pulmonary vasculature is normal. No consolidation, pleural effusion,
or pneumothorax. No acute osseous abnormalities are seen.
IMPRESSION: No active cardiopulmonary disease.

## 2018-09-02 ENCOUNTER — Encounter: Payer: Self-pay | Admitting: Sports Medicine

## 2018-09-02 ENCOUNTER — Ambulatory Visit: Payer: BC Managed Care – PPO | Admitting: Sports Medicine

## 2018-09-02 VITALS — BP 127/71 | HR 51 | Resp 16

## 2018-09-02 DIAGNOSIS — B351 Tinea unguium: Secondary | ICD-10-CM

## 2018-09-02 DIAGNOSIS — M79675 Pain in left toe(s): Secondary | ICD-10-CM

## 2018-09-02 DIAGNOSIS — B359 Dermatophytosis, unspecified: Secondary | ICD-10-CM

## 2018-09-02 DIAGNOSIS — M79674 Pain in right toe(s): Secondary | ICD-10-CM | POA: Diagnosis not present

## 2018-09-02 DIAGNOSIS — J45909 Unspecified asthma, uncomplicated: Secondary | ICD-10-CM | POA: Insufficient documentation

## 2018-09-02 MED ORDER — ALUMINUM CHLORIDE 20 % EX SOLN: Freq: Every day | CUTANEOUS | 5 refills | Status: DC

## 2018-09-02 MED ORDER — CLOTRIMAZOLE 1 % EX SOLN: 1.0000 "application " | Freq: Two times a day (BID) | CUTANEOUS | 5 refills | Status: DC

## 2018-09-02 MED ORDER — CLOTRIMAZOLE-BETAMETHASONE 1-0.05 % EX CREA: 1.0000 "application " | TOPICAL_CREAM | Freq: Two times a day (BID) | CUTANEOUS | 0 refills | Status: DC

## 2018-09-02 NOTE — Progress Notes (Signed)
Subjective: Paula Lee is a 58 y.o. female patient seen today in office with complaint of mildly painful thickened and discolored nails. Patient is desiring treatment for nail changes; has tried OTC topicals/Medication in the past with no improvement. Reports that nails are becoming difficult to manage because of the thickness and concerning dark areas. Patient also admits on her left foot issues with a rash and sweaty feet. Patient has no other pedal complaints at this time.   Review of Systems  Skin: Positive for itching.  All other systems reviewed and are negative.    Patient Active Problem List   Diagnosis Date Noted  . Asthma 09/02/2018  . Thyroid activity decreased 11/11/2015  . Seasonal allergies 11/11/2015  . Esophageal reflux 11/11/2015  . Family history of polyps in the colon 05/02/2015  . Post-void dribbling 05/21/2014  . Family history of breast cancer in mother 02/05/2014  . Menopausal symptoms 05/25/2013  . Healthcare maintenance 05/25/2013  . 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 Intramural leiomyoma of uterus 03/14/2012    Current Outpatient Medications on File Prior to Visit  Medication Sig Dispense Refill  . azithromycin (ZITHROMAX) 250 MG tablet TAKE 2 TABLETS BY MOUTH ON DAY 1 AND THEN TAKE 1 TABLET BY MOUTH ONCE A DAY ON DAY 2 THROUGH DAY 5    . cetirizine (ZYRTEC) 10 MG tablet Take 10 mg by mouth daily as needed for allergies.    . cholecalciferol (VITAMIN D) 1000 units tablet Take 1,000 Units by mouth daily.    Marland Kitchen esomeprazole (NEXIUM) 20 MG capsule Take 20 mg by mouth daily at 12 noon.    . fluticasone (FLONASE) 50 MCG/ACT nasal spray Place 2 sprays into both nostrils daily as needed for allergies or rhinitis.    Marland Kitchen ketotifen (ZADITOR) 0.025 % ophthalmic solution 1 drop 2 times daily.    Marland Kitchen levothyroxine (SYNTHROID, LEVOTHROID) 75 MCG tablet Take 75 mcg by mouth daily before breakfast.    . prednisoLONE acetate (PRED  FORTE) 1 % ophthalmic suspension INSTILL 1 DROP INTO EACH EYE TWICE DAILY (SHAKE WELL)    . vitamin E 400 UNIT capsule Take 400 Units by mouth daily.    Marland Kitchen XIIDRA 5 % SOLN INSTILL 1 DROP INTO EACH EYE TWICE DAILY     No current facility-administered medications on file prior to visit.     Allergies  Allergen Reactions  . Penicillins Other (See Comments)    Unknown of the reaction, can't remember  Has patient had a PCN reaction causing immediate rash, facial/tongue/throat swelling, SOB or lightheadedness with hypotension: n/a Has patient had a PCN reaction causing severe rash involving mucus membranes or skin necrosis: n/a Has patient had a PCN reaction that required hospitalization: n/a Has patient had a PCN reaction occurring within the last 10 years: n/a If all of the above answers are "NO", then may proceed with Cephalosporin use.     Objective: Physical Exam  General: Well developed, nourished, no acute distress, awake, alert and oriented x 3  Vascular: Dorsalis pedis artery 2/4 bilateral, Posterior tibial artery 2/4 bilateral, skin temperature warm to warm proximal to distal bilateral lower extremities, no varicosities, pedal hair present bilateral.  Neurological: Gross sensation present via light touch bilateral.   Dermatological: Skin is warm, dry, and supple bilateral, Nails 1-10 are tender, short thick, and discolored with mild subungal debris, dark streak to nails consistent with normal pigment changes, + webspace 4th webspace macerations present on left, no open  lesions present bilateral, Mild at medial hallux callus/corns/hyperkeratotic tissue present bilateral. No signs of infection bilateral.  Musculoskeletal: Asymptomatic boney hammertoe deformities noted bilateral. Muscular strength within normal limits without painon range of motion. No pain with calf compression bilateral.  Assessment and Plan:  Problem List Items Addressed This Visit    None    Visit Diagnoses     Nail fungal infection    -  Primary   Relevant Medications   azithromycin (ZITHROMAX) 250 MG tablet   clotrimazole (LOTRIMIN) 1 % external solution   clotrimazole-betamethasone (LOTRISONE) cream   Tinea       Relevant Medications   azithromycin (ZITHROMAX) 250 MG tablet   clotrimazole (LOTRIMIN) 1 % external solution   clotrimazole-betamethasone (LOTRISONE) cream   Toe pain, bilateral         -Examined patient -Discussed treatment options for painful dystrophic nails and tinea  -Encouraged good hygiene habits  -Rx Lotrisone, Drysol, and lotrimin to use to both feet daily  -Fungal culture was obtained by removing a portion of the hard nail itself from each of the involved toenails using a sterile nail nipper and sent to [facility] lab. Patient tolerated the biopsy procedure well without discomfort or need for anesthesia.  -Patient to return in 4 weeks for follow up evaluation and discussion of fungal culture results/med check or sooner if symptoms worsen.  Landis Martins, DPM

## 2018-09-16 ENCOUNTER — Ambulatory Visit: Payer: Self-pay | Admitting: Registered"

## 2018-09-30 ENCOUNTER — Telehealth: Payer: Self-pay | Admitting: *Deleted

## 2018-09-30 ENCOUNTER — Encounter: Payer: Self-pay | Admitting: Sports Medicine

## 2018-09-30 ENCOUNTER — Other Ambulatory Visit: Payer: Self-pay

## 2018-09-30 ENCOUNTER — Ambulatory Visit: Payer: BC Managed Care – PPO | Admitting: Sports Medicine

## 2018-09-30 DIAGNOSIS — B359 Dermatophytosis, unspecified: Secondary | ICD-10-CM | POA: Diagnosis not present

## 2018-09-30 DIAGNOSIS — M79674 Pain in right toe(s): Secondary | ICD-10-CM

## 2018-09-30 DIAGNOSIS — B351 Tinea unguium: Secondary | ICD-10-CM

## 2018-09-30 DIAGNOSIS — M79675 Pain in left toe(s): Secondary | ICD-10-CM | POA: Diagnosis not present

## 2018-09-30 MED ORDER — NONFORMULARY OR COMPOUNDED ITEM: 11 refills | Status: DC

## 2018-09-30 NOTE — Progress Notes (Signed)
Subjective: AZELYN BATIE is a 58 y.o. female patient seen today in office for fungal culture results. Patient has no other pedal complaints at this time.   Patient Active Problem List   Diagnosis Date Noted  . Asthma 09/02/2018  . Thyroid activity decreased 11/11/2015  . Seasonal allergies 11/11/2015  . Esophageal reflux 11/11/2015  . Family history of polyps in the colon 05/02/2015  . Post-void dribbling 05/21/2014  . Family history of breast cancer in mother 02/05/2014  . Menopausal symptoms 05/25/2013  . Healthcare maintenance 05/25/2013  . 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 Intramural leiomyoma of uterus 03/14/2012    Current Outpatient Medications on File Prior to Visit  Medication Sig Dispense Refill  . aluminum chloride (DRYSOL) 20 % external solution Apply topically at bedtime. 60 mL 5  . azithromycin (ZITHROMAX) 250 MG tablet TAKE 2 TABLETS BY MOUTH ON DAY 1 AND THEN TAKE 1 TABLET BY MOUTH ONCE A DAY ON DAY 2 THROUGH DAY 5    . cetirizine (ZYRTEC) 10 MG tablet Take 10 mg by mouth daily as needed for allergies.    . cholecalciferol (VITAMIN D) 1000 units tablet Take 1,000 Units by mouth daily.    . clotrimazole (LOTRIMIN) 1 % external solution Apply 1 application topically 2 (two) times daily. In between toes 60 mL 5  . clotrimazole-betamethasone (LOTRISONE) cream Apply 1 application topically 2 (two) times daily. 30 g 0  . esomeprazole (NEXIUM) 20 MG capsule Take 20 mg by mouth daily at 12 noon.    . fluticasone (FLONASE) 50 MCG/ACT nasal spray Place 2 sprays into both nostrils daily as needed for allergies or rhinitis.    Marland Kitchen ketotifen (ZADITOR) 0.025 % ophthalmic solution 1 drop 2 times daily.    Marland Kitchen levothyroxine (SYNTHROID, LEVOTHROID) 75 MCG tablet Take 75 mcg by mouth daily before breakfast.    . prednisoLONE acetate (PRED FORTE) 1 % ophthalmic suspension INSTILL 1 DROP INTO EACH EYE TWICE DAILY (SHAKE WELL)    . vitamin E 400  UNIT capsule Take 400 Units by mouth daily.    Marland Kitchen XIIDRA 5 % SOLN INSTILL 1 DROP INTO EACH EYE TWICE DAILY     No current facility-administered medications on file prior to visit.     Allergies  Allergen Reactions  . Penicillins Other (See Comments)    Unknown of the reaction, can't remember  Has patient had a PCN reaction causing immediate rash, facial/tongue/throat swelling, SOB or lightheadedness with hypotension: n/a Has patient had a PCN reaction causing severe rash involving mucus membranes or skin necrosis: n/a Has patient had a PCN reaction that required hospitalization: n/a Has patient had a PCN reaction occurring within the last 10 years: n/a If all of the above answers are "NO", then may proceed with Cephalosporin use.     Objective: Physical Exam  General: Well developed, nourished, no acute distress, awake, alert and oriented x 3  Vascular: Dorsalis pedis artery 2/4 bilateral, Posterior tibial artery 2/4 bilateral, skin temperature warm to warm proximal to distal bilateral lower extremities, no varicosities, pedal hair present bilateral.  Neurological: Gross sensation present via light touch bilateral.   Dermatological: Skin is warm, dry, and supple bilateral, Nails 1-10 are tender, short thick, and discolored with mild subungal debris, no webspace macerations present bilateral, + scaling skin on left, no open lesions present bilateral, no callus/corns/hyperkeratotic tissue present bilateral. No signs of infection bilateral.  Musculoskeletal: No symptomatic boney deformities noted bilateral. Muscular strength within  normal limits without painon range of motion. No pain with calf compression bilateral.  Fungal culture + Dermatophyte   Assessment and Plan:  Problem List Items Addressed This Visit    None    Visit Diagnoses    Nail fungal infection    -  Primary   Tinea       Toe pain, bilateral          -Examined patient -Discussed treatment options for painful  mycotic nails -Patient opt for topical -Continue with antifungal meds as previously given  -Advised good hygiene habits -Patient to return as needed/1 year or sooner if symptoms worsen.  Landis Martins, DPM

## 2018-09-30 NOTE — Telephone Encounter (Signed)
Orders faxed to [facility] 

## 2018-09-30 NOTE — Telephone Encounter (Signed)
-----   Message from Landis Martins, [place] sent at 09/30/2018  8:23 AM EDT ----- Regarding: Topical Antifungal Order topical Compound -Dr Cannon Kettle

## 2018-12-15 ENCOUNTER — Other Ambulatory Visit: Payer: Self-pay | Admitting: Internal Medicine

## 2018-12-15 DIAGNOSIS — Z1231 Encounter for screening mammogram for malignant neoplasm of breast: Secondary | ICD-10-CM

## 2019-01-30 ENCOUNTER — Other Ambulatory Visit: Payer: Self-pay

## 2019-01-30 ENCOUNTER — Ambulatory Visit
Admission: RE | Admit: 2019-01-30 | Discharge: 2019-01-30 | Disposition: A | Discharge status: Home / Self Care | Payer: BC Managed Care – PPO | Source: Ambulatory Visit | Attending: Internal Medicine | Admitting: Internal Medicine

## 2019-01-30 DIAGNOSIS — Z1231 Encounter for screening mammogram for malignant neoplasm of breast: Secondary | ICD-10-CM

## 2019-07-20 ENCOUNTER — Other Ambulatory Visit: Payer: Self-pay | Admitting: Internal Medicine

## 2019-07-20 DIAGNOSIS — Z1231 Encounter for screening mammogram for malignant neoplasm of breast: Secondary | ICD-10-CM

## 2019-09-10 ENCOUNTER — Other Ambulatory Visit (HOSPITAL_COMMUNITY): Payer: Self-pay | Admitting: Endocrinology

## 2019-09-10 DIAGNOSIS — R011 Cardiac murmur, unspecified: Secondary | ICD-10-CM

## 2019-09-23 ENCOUNTER — Ambulatory Visit (HOSPITAL_COMMUNITY): Payer: BC Managed Care – PPO | Attending: Cardiovascular Disease

## 2019-09-23 ENCOUNTER — Other Ambulatory Visit: Payer: Self-pay

## 2019-09-23 DIAGNOSIS — R011 Cardiac murmur, unspecified: Secondary | ICD-10-CM | POA: Insufficient documentation

## 2020-02-01 ENCOUNTER — Ambulatory Visit
Admission: RE | Admit: 2020-02-01 | Discharge: 2020-02-01 | Disposition: A | Discharge status: Home / Self Care | Payer: BC Managed Care – PPO | Source: Ambulatory Visit | Attending: Internal Medicine | Admitting: Internal Medicine

## 2020-02-01 ENCOUNTER — Other Ambulatory Visit: Payer: Self-pay

## 2020-02-01 DIAGNOSIS — Z1231 Encounter for screening mammogram for malignant neoplasm of breast: Secondary | ICD-10-CM

## 2020-02-08 DIAGNOSIS — K219 Gastro-esophageal reflux disease without esophagitis: Secondary | ICD-10-CM | POA: Insufficient documentation

## 2020-02-08 DIAGNOSIS — R0981 Nasal congestion: Secondary | ICD-10-CM | POA: Insufficient documentation

## 2020-09-23 DIAGNOSIS — R87612 Low grade squamous intraepithelial lesion on cytologic smear of cervix (LGSIL): Secondary | ICD-10-CM | POA: Insufficient documentation

## 2020-12-05 ENCOUNTER — Other Ambulatory Visit: Payer: Self-pay | Admitting: Internal Medicine

## 2020-12-05 DIAGNOSIS — Z1231 Encounter for screening mammogram for malignant neoplasm of breast: Secondary | ICD-10-CM

## 2021-02-02 ENCOUNTER — Ambulatory Visit
Admission: RE | Admit: 2021-02-02 | Discharge: 2021-02-02 | Disposition: A | Discharge status: Home / Self Care | Payer: BC Managed Care – PPO | Source: Ambulatory Visit | Attending: Internal Medicine | Admitting: Internal Medicine

## 2021-02-02 ENCOUNTER — Other Ambulatory Visit: Payer: Self-pay

## 2021-02-02 DIAGNOSIS — Z1231 Encounter for screening mammogram for malignant neoplasm of breast: Secondary | ICD-10-CM

## 2021-06-26 NOTE — Progress Notes (Signed)
Cardiology Office Note:    Date:  06/29/2021   ID:  Paula Lee, DOB 17-Jun-1961, MRN [id number]  PCP:  Jilda Panda, MD   [facility] Cardiologist:  Lenna Sciara, MD Referring MD: Jilda Panda, MD   Chief Complaint/Reason for Referral:  Chest pain}   ASSESSMENT & PLAN:    Chest pain, unspecified type - Plan: EKG 12-Lead Her chest pain is likely not cardiac due to the fact it is tender to palpation and sounds more like a musculoskeletal issue.  Continue to monitor for now.  I will see her back in 1 year or earlier if needed.   Palpitations - Plan: EKG 12-Lead Her palpitations have not recurred since being on the lower dose of thyroid medication.  Her echocardiogram demonstrated normal function with no significant valvular abnormalities.  Monitor for now.          Dispo:  No follow-ups on file.     Medication Adjustments/Labs and Tests Ordered: Current medicines are reviewed at length with the patient today.  Concerns regarding medicines are outlined above.   Tests Ordered: Orders Placed This Encounter  Procedures   EKG 12-Lead     Medication Changes: No orders of the defined types were placed in this encounter.   History of Present Illness:    The patient is a 60 y.o. female with the indicated medical history here for chest pain.  Patient was seen by her PCP fairly recently with these complaints.  She described left-sided chest discomfort.  The area was tender to palpation as well.  She denies any exertional angina, exertional dyspnea, presyncope, or syncope.  She recently had her thyroid medication dose increased.  When this happened she had a few intermittent palpitations.  The thyroid medication was then decreased back to her pre-existing dose and she has not had any recurrence of the symptoms.  She has required no emergency room visits or hospitalizations.  She is otherwise well and Lee to do all of her activities of daily living.    Previous  Medical History: Past Medical History:  Diagnosis Date   Allergic rhinitis    Allergy    Asthma    Chronic sinusitis    Cyst (solitary) of breast, right    Depression    GERD (gastroesophageal reflux disease)    Hematuria, microscopic    Hyperglycemia    Hypothyroid    Lumbago    Obesity    Onychomycosis    Uterine fibroid      Current Medications: Current Meds  Medication Sig   cetirizine (ZYRTEC) 10 MG tablet Take 10 mg by mouth daily as needed for allergies.   cholecalciferol (VITAMIN D) 1000 units tablet Take 1,000 Units by mouth daily.   ketotifen (ZADITOR) 0.025 % ophthalmic solution 1 drop 2 times daily.   levothyroxine (SYNTHROID, LEVOTHROID) 75 MCG tablet Take 75 mcg by mouth daily before breakfast.   NONFORMULARY OR COMPOUNDED ITEM [facility]:  Antifungal cream - Terbinafine 3%, Fluconazole 2%, Tea Tree Oil 5%, Urea 10%, Ibuprofen 2% in DMSO 8ml suspension. Apply to affected toenail(s) once at bedtime or twice daily.   XIIDRA 5 % SOLN INSTILL 1 DROP INTO EACH EYE TWICE DAILY     Allergies:    Penicillins   Social History:   Social History   Tobacco Use   Smoking status: Never   Smokeless tobacco: Never  Vaping Use   Vaping Use: Never used  Substance Use Topics   Alcohol use: No  Alcohol/week: 0.0 standard drinks   Drug use: No     Family Hx: Family History  Problem Relation Age of Onset   Hypertension Mother    Diabetes Mother    Cancer Mother      Review of Systems:   Please see the history of present illness.    All other systems reviewed and are negative.  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 Exam Location:  [facility]  Procedure: 2D Echo, Cardiac Doppler and Color Doppler  Indications:    R01.1  History:        Patient has no prior history of Echocardiogram examinations. Signs/Symptoms:Murmur.  Sonographer:    Coralyn Helling RDCS Referring Phys: [place]  1. Left ventricular ejection fraction, by estimation, is 65 to 70%. The left ventricle has normal function. The left ventricle has no regional wall motion abnormalities. Left ventricular diastolic parameters were normal. 2. Right ventricular systolic function is normal. The right ventricular size is normal. There is mildly elevated pulmonary artery systolic pressure. 3. The mitral valve is normal in structure. Trivial mitral valve regurgitation. No evidence of mitral stenosis. 4. The aortic valve is normal in structure. Aortic valve regurgitation is not visualized. No aortic stenosis is present.  FINDINGS Left Ventricle: Left ventricular ejection fraction, by estimation, is 65 to 70%. The left ventricle has normal function. The left ventricle has no regional wall motion abnormalities. The left ventricular internal cavity size was normal in size. There is no left ventricular hypertrophy. Left ventricular diastolic parameters were normal.  Right Ventricle: The right ventricular size is normal. No increase in right ventricular wall thickness. Right ventricular systolic function is normal. There is mildly elevated pulmonary artery systolic pressure. The tricuspid regurgitant velocity is 2.57 m/s, and with an assumed right atrial pressure of 10 mmHg, the estimated right ventricular systolic pressure is 92.4 mmHg.  Left Atrium: Left atrial size was normal in size.  Right Atrium: Right atrial size was normal in  size.  Pericardium: There is no evidence of pericardial effusion.  Mitral Valve: The mitral valve is normal in structure. Trivial mitral valve regurgitation. No evidence of mitral valve stenosis.  Tricuspid Valve: The tricuspid valve is normal in structure. Tricuspid valve regurgitation is trivial. No evidence of tricuspid stenosis.  Aortic Valve: The aortic valve is normal in structure. Aortic valve regurgitation is not visualized. No aortic stenosis is present.  Pulmonic Valve: The pulmonic valve was normal in structure. Pulmonic valve regurgitation is not visualized. No evidence of pulmonic stenosis.  Aorta: The aortic root and ascending aorta are structurally normal, with no evidence of dilitation.  IAS/Shunts: The atrial septum is grossly normal.   LEFT VENTRICLE PLAX 2D LVIDd:         4.40 cm  Diastology LVIDs:         2.40 cm  LV e' lateral:   10.00 cm/s LV PW:         0.90 cm  LV E/e' lateral: 9.8 LV IVS:        1.10 cm  LV e' medial:    9.68 cm/s LVOT diam:     1.90 cm  LV E/e' medial:  10.1 LV SV:         86 LV SV Index:   43 LVOT Area:     2.84 cm   RIGHT VENTRICLE             IVC RV S prime:     13.80 cm/s  IVC diam: 1.50 cm TAPSE (M-mode): 2.9 cm RVSP:           29.4 mmHg  LEFT ATRIUM             Index       RIGHT ATRIUM           Index LA diam:        4.00 cm 2.01 cm/m  RA Pressure: 3.00 mmHg LA Vol (A2C):   54.8 ml 27.57 ml/m RA Area:     10.40 cm LA Vol (A4C):   54.1 ml 27.22 ml/m RA Volume:   22.80 ml  11.47 ml/m LA Biplane Vol: 60.2 ml 30.29 ml/m AORTIC VALVE LVOT Vmax:   112.00 cm/s LVOT Vmean:  77.100 cm/s LVOT VTI:    0.302 m  AORTA Ao Root diam: 2.90 cm Ao Asc diam:  3.20 cm  MV E velocity: 97.90 cm/s  TRICUSPID VALVE MV A velocity: 86.90 cm/s  TR Peak grad:   26.4 mmHg MV E/A ratio:  1.13        TR Vmax:        257.00 cm/s Estimated RAP:  3.00 mmHg RVSP:           29.4 mmHg  SHUNTS Systemic VTI:  0.30 m Systemic Diam: 1.90 cm      Imaging studies that I have independently reviewed today:  No relevant imaging available  Recent Labs: No results found for requested labs within last 8760 hours.   Recent labs demonstrate hemoglobin 13.9 platelets 285 TSH 1.93 and free T4 1.6 both within normal limits, creatinine 0.71, total cholesterol 220, LDL cholesterol 132, HDL 67  Recent Lipid Panel No results found for: CHOL, TRIG, HDL, LDLCALC, LDLDIRECT  Risk Assessment/Calculations:          Physical Exam:    VS:  BP 130/84   Pulse (!) 51   Ht 5\' 7"  (1.702 m)   Wt 191 lb 12.8 oz (87 kg)   LMP 12/22/2016   SpO2 97%   BMI 30.04 kg/m    Wt Readings from Last 3 Encounters:  06/29/21 191 lb 12.8 oz (87 kg)  12/22/16 192 lb (87.1 kg)  11/11/15 190 lb 9.6 oz (86.5 kg)    GENERAL:  No apparent distress, AOx3 HEENT:  No carotid bruits, +2 carotid impulses, no scleral icterus CAR: RRR, soft systolic murmur, gallops, rubs, or thrills RES:  Clear to auscultation bilaterally ABD:  Soft, nontender, nondistended, positive bowel sounds x 4 VASC:  +2 radial pulses, +2 carotid pulses, palpable  pedal pulses NEURO:  CN 2-12 grossly intact; motor and sensory grossly intact PSYCH:  No active depression or anxiety EXT:  No edema, ecchymosis, or cyanosis  Signed, Early Osmond, MD  06/29/2021 8:31 AM    [address] [place], [place], [place]  [address] Phone: [phone]; Fax: [phone]

## 2021-06-29 ENCOUNTER — Other Ambulatory Visit: Payer: Self-pay

## 2021-06-29 ENCOUNTER — Encounter: Payer: Self-pay | Admitting: Internal Medicine

## 2021-06-29 ENCOUNTER — Ambulatory Visit: Payer: BC Managed Care – PPO | Admitting: Internal Medicine

## 2021-06-29 VITALS — BP 130/84 | HR 51 | Ht 67.0 in | Wt 191.8 lb

## 2021-06-29 DIAGNOSIS — R079 Chest pain, unspecified: Secondary | ICD-10-CM

## 2021-06-29 DIAGNOSIS — R002 Palpitations: Secondary | ICD-10-CM

## 2021-06-29 NOTE — Patient Instructions (Signed)
Medication Instructions:  No changes *If you need a refill on your cardiac medications before your next appointment, please call your pharmacy*   Lab Work: none   Testing/Procedures: None   Follow-Up: At [facility], you and your health needs are our priority.  As part of our continuing mission to provide you with exceptional heart care, we have created designated Provider Care Teams.  These Care Teams include your primary Cardiologist (physician) and Advanced Practice Providers (APPs -  Physician Assistants and Nurse Practitioners) who all work together to provide you with the care you need, when you need it.  Your next appointment:   12 month(s)  The format for your next appointment:   In Person  Provider:   Early Osmond, MD     Other Instructions

## 2021-09-04 ENCOUNTER — Other Ambulatory Visit: Payer: Self-pay | Admitting: Internal Medicine

## 2021-09-04 DIAGNOSIS — Z1231 Encounter for screening mammogram for malignant neoplasm of breast: Secondary | ICD-10-CM

## 2021-09-28 ENCOUNTER — Ambulatory Visit: Payer: BC Managed Care – PPO | Admitting: Sports Medicine

## 2022-02-05 ENCOUNTER — Ambulatory Visit
Admission: RE | Admit: 2022-02-05 | Discharge: 2022-02-05 | Disposition: A | Discharge status: Home / Self Care | Payer: BC Managed Care – PPO | Source: Ambulatory Visit | Attending: Internal Medicine | Admitting: Internal Medicine

## 2022-02-05 DIAGNOSIS — Z1231 Encounter for screening mammogram for malignant neoplasm of breast: Secondary | ICD-10-CM

## 2022-07-23 ENCOUNTER — Encounter: Payer: Self-pay | Admitting: Internal Medicine

## 2022-08-14 ENCOUNTER — Other Ambulatory Visit: Payer: Self-pay

## 2022-08-14 ENCOUNTER — Encounter (HOSPITAL_BASED_OUTPATIENT_CLINIC_OR_DEPARTMENT_OTHER): Payer: Self-pay | Admitting: Emergency Medicine

## 2022-08-14 ENCOUNTER — Emergency Department (HOSPITAL_BASED_OUTPATIENT_CLINIC_OR_DEPARTMENT_OTHER)
Admission: EM | Admit: 2022-08-14 | Discharge: 2022-08-14 | Disposition: A | Discharge status: Home / Self Care | Payer: BC Managed Care – PPO | Attending: Emergency Medicine | Admitting: Emergency Medicine

## 2022-08-14 DIAGNOSIS — E039 Hypothyroidism, unspecified: Secondary | ICD-10-CM | POA: Diagnosis not present

## 2022-08-14 DIAGNOSIS — S60932A Unspecified superficial injury of left thumb, initial encounter: Secondary | ICD-10-CM | POA: Diagnosis present

## 2022-08-14 DIAGNOSIS — J45909 Unspecified asthma, uncomplicated: Secondary | ICD-10-CM | POA: Diagnosis not present

## 2022-08-14 DIAGNOSIS — W260XXA Contact with knife, initial encounter: Secondary | ICD-10-CM | POA: Insufficient documentation

## 2022-08-14 DIAGNOSIS — Z23 Encounter for immunization: Secondary | ICD-10-CM | POA: Insufficient documentation

## 2022-08-14 DIAGNOSIS — Z79899 Other long term (current) drug therapy: Secondary | ICD-10-CM | POA: Insufficient documentation

## 2022-08-14 DIAGNOSIS — S61012A Laceration without foreign body of left thumb without damage to nail, initial encounter: Secondary | ICD-10-CM | POA: Insufficient documentation

## 2022-08-14 MED ORDER — LIDOCAINE-EPINEPHRINE (PF) 2 %-1:200000 IJ SOLN
INTRAMUSCULAR | Status: AC
  Filled 2022-08-14: qty 20

## 2022-08-14 MED ORDER — TETANUS-DIPHTH-ACELL PERTUSSIS 5-2.5-18.5 LF-MCG/0.5 IM SUSY
0.5000 mL | PREFILLED_SYRINGE | Freq: Once | INTRAMUSCULAR | Status: AC
  Administered 2022-08-14: 0.5 mL via INTRAMUSCULAR
  Filled 2022-08-14: qty 0.5

## 2022-08-14 NOTE — ED Provider Notes (Signed)
[facility] Provider Note: Paula Spurling, MD, FACEP  CSN: [id number] MRN: [id number] ARRIVAL: 08/14/22 at [place]: [id number]   CHIEF COMPLAINT  Laceration   HISTORY OF PRESENT ILLNESS  08/14/22 1:57 AM Paula Lee is a 62 y.o. female who was putting away dishes about 10 PM yesterday evening and cut her left thumb.  She has a laceration to her left thumb.  Bleeding is controlled.  Tetanus status is unknown.  She rates associated pain as a 5 out of 10.   Past Medical History:  Diagnosis Date   Allergic rhinitis    Allergy    Asthma    Chronic sinusitis    Cyst (solitary) of breast, right    Depression    GERD (gastroesophageal reflux disease)    Hematuria, microscopic    Hyperglycemia    Hypothyroid    Lumbago    Obesity    Onychomycosis    Uterine fibroid     Past Surgical History:  Procedure Laterality Date   BREAST BIOPSY Left 2018   benign   HERNIA REPAIR     TONSILLECTOMY      Family History  Problem Relation Age of Onset   Hypertension Mother    Diabetes Mother    Cancer Mother     Social History   Tobacco Use   Smoking status: Never   Smokeless tobacco: Never  Vaping Use   Vaping Use: Never used  Substance Use Topics   Alcohol use: No    Alcohol/week: 0.0 standard drinks of alcohol   Drug use: No    Prior to Admission medications   Medication Sig Start Date End Date Taking? Authorizing Provider  cetirizine (ZYRTEC) 10 MG tablet Take 10 mg by mouth daily as needed for allergies.    [provider]  cholecalciferol (VITAMIN D) 1000 units tablet Take 1,000 Units by mouth daily.    [provider]  ketotifen (ZADITOR) 0.025 % ophthalmic solution 1 drop 2 times daily.    [provider]  levothyroxine (SYNTHROID, LEVOTHROID) 75 MCG tablet Take 75 mcg by mouth daily before breakfast.    [provider]  NONFORMULARY OR COMPOUNDED ITEM [facility]:  Antifungal cream - Terbinafine 3%,  Fluconazole 2%, Tea Tree Oil 5%, Urea 10%, Ibuprofen 2% in DMSO 25m suspension. Apply to affected toenail(s) once at bedtime or twice daily. 09/30/18   Stover, TLady Saucier DPM  XIIDRA 5 % SOLN INSTILL 1 DROP INTO EACH EYE TWICE DAILY 04/28/18   [provider]    Allergies Penicillins   REVIEW OF SYSTEMS  Negative except as noted here or in the History of Present Illness.   PHYSICAL EXAMINATION  Initial Vital Signs Blood pressure (!) 157/80, pulse (!) 53, temperature 97.9 F (36.6 C), resp. rate 20, last menstrual period 12/22/2016, SpO2 100 %.  Examination General: Well-developed, well-nourished female in no acute distress; appearance consistent with age of record HENT: normocephalic; atraumatic Eyes: Normal appearance Neck: supple Heart: regular rate and rhythm Lungs: clear to auscultation bilaterally Abdomen: soft; nondistended; nontender; bowel sounds present Extremities: No deformity; full range of motion; edema of lower legs Neurologic: Awake, alert and oriented; motor function intact in all extremities and symmetric; no facial droop Skin: Warm and dry; laceration pad of left thumb Psychiatric: Normal mood and affect   RESULTS  Summary of this visit's results, reviewed and interpreted by myself:   EKG Interpretation  Date/Time:    Ventricular Rate:    PR Interval:  QRS Duration:   QT Interval:    QTC Calculation:   R Axis:     Text Interpretation:         Laboratory Studies: No results found for this or any previous visit (from the past 24 hour(s)). Imaging Studies: No results found.  ED COURSE and MDM  Nursing notes, initial and subsequent vitals signs, including pulse oximetry, reviewed and interpreted by myself.  Vitals:   08/14/22 0033  BP: (!) 157/80  Pulse: (!) 53  Resp: 20  Temp: 97.9 F (36.6 C)  SpO2: 100%   Medications  Tdap (BOOSTRIX) injection 0.5 mL (0.5 mLs Intramuscular Given 08/14/22 0205)  lidocaine-EPINEPHrine  (XYLOCAINE W/EPI) 2 %-1:200000 (PF) injection (  Given 08/14/22 0206)   Wound closed as described below.  Because this was a cut with a clean knife I do not believe antibiotic prophylaxis is required.  Also the wound was fairly shallow.  Tetanus updated as she could not recall her last tetanus immunization.   PROCEDURES  Procedures LACERATION REPAIR Performed by: Karen Chafe [name] Authorized by: Karen Chafe [name] Consent: Verbal consent obtained. Risks and benefits: risks, benefits and alternatives were discussed Consent given by: patient Patient identity confirmed: provided demographic data Prepped and Draped in normal sterile fashion Wound explored  Laceration Location: Left thumb  Laceration Length: 1.3 cm  No Foreign Bodies seen or palpated  Anesthesia: local infiltration  Local anesthetic: lidocaine 2% with epinephrine  Anesthetic total: 1 ml  Irrigation method: syringe Amount of cleaning: standard  Skin closure: 4-0 Prolene  Number of sutures: 3  Technique: Simple interrupted  Patient tolerance: Patient tolerated the procedure well with no immediate complications.   ED DIAGNOSES     ICD-10-CM   1. Laceration of left thumb without foreign body without damage to nail, initial encounter  T65.465K          Shanon Rosser, MD 08/14/22 [phone]

## 2022-08-14 NOTE — ED Notes (Signed)
Wound irrigated with sterile water and covered. Laceration cart bedside for MD. Bleeding controlled at this time.

## 2022-08-14 NOTE — ED Triage Notes (Addendum)
Cut by knife while putting dishes away. Left thumb Happened at 10pm, still bleeding.  Rinsed and dressing applied in triage,  About 2 cm lac, scant amount of bleeding  Unknown last tetanus vaccine date

## 2022-11-28 ENCOUNTER — Other Ambulatory Visit: Payer: Self-pay | Admitting: Internal Medicine

## 2022-11-28 DIAGNOSIS — Z1231 Encounter for screening mammogram for malignant neoplasm of breast: Secondary | ICD-10-CM

## 2022-12-06 ENCOUNTER — Ambulatory Visit: Payer: BC Managed Care – PPO | Admitting: Podiatry

## 2023-02-08 ENCOUNTER — Ambulatory Visit
Admission: RE | Admit: 2023-02-08 | Discharge: 2023-02-08 | Disposition: A | Discharge status: Home / Self Care | Payer: BC Managed Care – PPO | Source: Ambulatory Visit | Attending: Internal Medicine | Admitting: Internal Medicine

## 2023-02-08 DIAGNOSIS — Z1231 Encounter for screening mammogram for malignant neoplasm of breast: Secondary | ICD-10-CM

## 2023-03-21 NOTE — Progress Notes (Signed)
Cardiology Office Note:   Date:  03/27/2023  ID:  Paula Lee, DOB Oct 23, 1960, MRN [id number] PCP:  Ralene Ok, MD  [facility] Providers Cardiologist:  Alverda Skeans, MD Referring MD: Ralene Ok, MD   Chief Complaint/Reason for Referral: Cardiology follow-up ASSESSMENT:    1. Elevated blood pressure reading   2. Bradycardia     PLAN:   In order of problems listed above: 1.  Elevated blood pressure reading: Blood pressure today is 110/70.  Will obtain an echocardiogram to evaluate for left ventricular hypertrophy and if present may start a blood pressure medication to help with this.  Of note her EKG today demonstrates no left ventricular hypertrophy. 2.  Bradycardia: Clinically asymptomatic; monitor.      Dispo:  Return if symptoms worsen or fail to improve.    Medication Adjustments/Labs and Tests Ordered: Current medicines are reviewed at length with the patient today.  Concerns regarding medicines are outlined above.  The following changes have been made:  no change   Labs/tests ordered: Orders Placed This Encounter  Procedures   EKG 12-Lead   EKG 12-Lead   ECHOCARDIOGRAM COMPLETE    Medication Changes: No orders of the defined types were placed in this encounter.   Current medicines are reviewed at length with the patient today.  The patient does not have concerns regarding medicines.  History of Present Illness:   FOCUSED PROBLEM LIST:   GERD Hypothyroidism  December 2022:  The patient is a 62 y.o. female with the indicated medical history here for chest pain.  Patient was seen by her PCP fairly recently with these complaints.  She described left-sided chest discomfort.  The area was tender to palpation as well.  She denies any exertional angina, exertional dyspnea, presyncope, or syncope.  She recently had her thyroid medication dose increased.  When this happened she had a few intermittent palpitations.  The thyroid medication was then decreased back  to her pre-existing dose and she has not had any recurrence of the symptoms.  She has required no emergency room visits or hospitalizations.  She is otherwise well and able to do all of her activities of daily living.  Plan: No further workup; follow-up in 1 year if needed.  September 2024: The patient was seen by her primary care provider recently and noted to have a blood pressure of 161/72 with a heart rate of 52.  The patient tells me that day she was quite agitated.  For this reason she is referred back for further recommendations.  Today the patient's blood pressure is normotensive.  She tells me that her blood pressures at home are also in the normal range.  She denies any presyncope, syncope, palpitations, paroxysmal active dyspnea, orthopnea.  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  She fortunately has not required any emergency room visits or hospitalizations.    Current Medications: Current Meds  Medication Sig   cetirizine (ZYRTEC) 10 MG tablet Take 10 mg by mouth daily as needed for allergies.   cholecalciferol (VITAMIN D) 1000 units tablet Take 1,000 Units by mouth daily.   ketotifen (ZADITOR) 0.025 % ophthalmic solution 1 drop 2 times daily.   levothyroxine (SYNTHROID, LEVOTHROID) 75 MCG tablet Take 75 mcg by mouth daily before breakfast.   NONFORMULARY OR COMPOUNDED ITEM [facility]:  Antifungal cream - Terbinafine 3%, Fluconazole 2%, Tea Tree Oil 5%, Urea 10%, Ibuprofen 2% in DMSO 30ml suspension. Apply to affected toenail(s) once at bedtime or twice  daily.   XIIDRA 5 % SOLN INSTILL 1 DROP INTO EACH EYE TWICE DAILY     Allergies:    Codeine and Penicillins   Social History:   Social History   Tobacco Use   Smoking status: Never   Smokeless tobacco: Never  Vaping Use   Vaping status: Never Used  Substance Use Topics   Alcohol use: No    Alcohol/week: 0.0 standard drinks of alcohol   Drug use: No     Family Hx: Family History  Problem  Relation Age of Onset   Breast cancer Mother    Hypertension Mother    Diabetes Mother    Cancer Mother      Review of Systems:   Please see the history of present illness.    All other systems reviewed and are negative.     EKGs/Labs/Other Test Reviewed:   EKG: EKG performed December 2022 demonstrates sinus rhythm with anterior MI pattern  EKG Interpretation Date/Time:  Wednesday March 27 2023 14:25:01 EDT Ventricular Rate:  48 PR Interval:  188 QRS Duration:  84 QT Interval:  452 QTC Calculation: 403 R Axis:   58  Text Interpretation: Sinus bradycardia Septal infarct (cited on or before 27-Mar-2023) When compared with ECG of 27-Mar-2023 14:24, No significant change was found Confirmed by Alverda Skeans ([id number]) on 03/27/2023 2:30:49 PM        Prior CV studies reviewed: Cardiac Studies & Procedures       ECHOCARDIOGRAM  ECHOCARDIOGRAM COMPLETE 09/23/2019  Narrative ECHOCARDIOGRAM REPORT    Patient Name:   Paula Lee Date of Exam: 09/23/2019 Medical Rec #:  [id number]        Height:       67.0 in Accession #:    [id number]       Weight:       192.0 lb Date of Birth:  05/16/1961         BSA:          1.987 m Patient Age:    58 years         BP:           127/71 mmHg Patient Gender: F                HR:           48 bpm. Exam Location:  [address]  Procedure: 2D Echo, Cardiac Doppler and Color Doppler  Indications:    R01.1  History:        Patient has no prior history of Echocardiogram examinations. Signs/Symptoms:Murmur.  Sonographer:    Samule Ohm RDCS Referring Phys: [phone] [facility]  IMPRESSIONS   1. Left ventricular ejection fraction, by estimation, is 65 to 70%. The left ventricle has normal function. The left ventricle has no regional wall motion abnormalities. Left ventricular diastolic parameters were normal. 2. Right ventricular systolic function is normal. The right ventricular size is normal. There is mildly elevated pulmonary  artery systolic pressure. 3. The mitral valve is normal in structure. Trivial mitral valve regurgitation. No evidence of mitral stenosis. 4. The aortic valve is normal in structure. Aortic valve regurgitation is not visualized. No aortic stenosis is present.  FINDINGS Left Ventricle: Left ventricular ejection fraction, by estimation, is 65 to 70%. The left ventricle has normal function. The left ventricle has no regional wall motion abnormalities. The left ventricular internal cavity size was normal in size. There is no left ventricular hypertrophy. Left ventricular diastolic parameters were normal.  Right Ventricle:  The right ventricular size is normal. No increase in right ventricular wall thickness. Right ventricular systolic function is normal. There is mildly elevated pulmonary artery systolic pressure. The tricuspid regurgitant velocity is 2.57 m/s, and with an assumed right atrial pressure of 10 mmHg, the estimated right ventricular systolic pressure is 36.4 mmHg.  Left Atrium: Left atrial size was normal in size.  Right Atrium: Right atrial size was normal in size.  Pericardium: There is no evidence of pericardial effusion.  Mitral Valve: The mitral valve is normal in structure. Trivial mitral valve regurgitation. No evidence of mitral valve stenosis.  Tricuspid Valve: The tricuspid valve is normal in structure. Tricuspid valve regurgitation is trivial. No evidence of tricuspid stenosis.  Aortic Valve: The aortic valve is normal in structure. Aortic valve regurgitation is not visualized. No aortic stenosis is present.  Pulmonic Valve: The pulmonic valve was normal in structure. Pulmonic valve regurgitation is not visualized. No evidence of pulmonic stenosis.  Aorta: The aortic root and ascending aorta are structurally normal, with no evidence of dilitation.  IAS/Shunts: The atrial septum is grossly normal.   LEFT VENTRICLE PLAX 2D LVIDd:         4.40 cm  Diastology LVIDs:          2.40 cm  LV e' lateral:   10.00 cm/s LV PW:         0.90 cm  LV E/e' lateral: 9.8 LV IVS:        1.10 cm  LV e' medial:    9.68 cm/s LVOT diam:     1.90 cm  LV E/e' medial:  10.1 LV SV:         86 LV SV Index:   43 LVOT Area:     2.84 cm   RIGHT VENTRICLE             IVC RV S prime:     13.80 cm/s  IVC diam: 1.50 cm TAPSE (M-mode): 2.9 cm RVSP:           29.4 mmHg  LEFT ATRIUM             Index       RIGHT ATRIUM           Index LA diam:        4.00 cm 2.01 cm/m  RA Pressure: 3.00 mmHg LA Vol (A2C):   54.8 ml 27.57 ml/m RA Area:     10.40 cm LA Vol (A4C):   54.1 ml 27.22 ml/m RA Volume:   22.80 ml  11.47 ml/m LA Biplane Vol: 60.2 ml 30.29 ml/m AORTIC VALVE LVOT Vmax:   112.00 cm/s LVOT Vmean:  77.100 cm/s LVOT VTI:    0.302 m  AORTA Ao Root diam: 2.90 cm Ao Asc diam:  3.20 cm  MV E velocity: 97.90 cm/s  TRICUSPID VALVE MV A velocity: 86.90 cm/s  TR Peak grad:   26.4 mmHg MV E/A ratio:  1.13        TR Vmax:        257.00 cm/s Estimated RAP:  3.00 mmHg RVSP:           29.4 mmHg  SHUNTS Systemic VTI:  0.30 m Systemic Diam: 1.90 cm  Kristeen Miss MD Electronically signed by Kristeen Miss MD Signature Date/Time: 09/23/2019/10:49:38 AM    Final             Recent Labs: No results found for requested labs within last 365 days.   Lipid Panel No  results found for: "CHOL", "TRIG", "HDL", "CHOLHDL", "VLDL", "LDLCALC", "LDLDIRECT"  Risk Assessment/Calculations:          Physical Exam:   VS:  BP 110/70   Pulse (!) 48   Ht 5\' 7"  (1.702 m)   Wt 186 lb (84.4 kg)   LMP 12/22/2016   SpO2 98%   BMI 29.13 kg/m        Wt Readings from Last 3 Encounters:  03/27/23 186 lb (84.4 kg)  06/29/21 191 lb 12.8 oz (87 kg)  12/22/16 192 lb (87.1 kg)      GENERAL:  No apparent distress, AOx3 HEENT:  No carotid bruits, +2 carotid impulses, no scleral icterus CAR: RRR no murmurs, gallops, rubs, or thrills RES:  Clear to auscultation bilaterally ABD:  Soft,  nontender, nondistended, positive bowel sounds x 4 VASC:  +2 radial pulses, +2 carotid pulses NEURO:  CN 2-12 grossly intact; motor and sensory grossly intact PSYCH:  No active depression or anxiety EXT:  No edema, ecchymosis, or cyanosis  Signed, Orbie Pyo, MD  03/27/2023 2:42 PM    [facility] [address] [place], [place], [place]  [address] Phone: [phone]; Fax: [phone]   Note:  This document was prepared using Dragon voice recognition software and may include unintentional dictation errors.

## 2023-03-27 ENCOUNTER — Encounter: Payer: Self-pay | Admitting: Internal Medicine

## 2023-03-27 ENCOUNTER — Ambulatory Visit: Payer: BC Managed Care – PPO | Attending: Internal Medicine | Admitting: Internal Medicine

## 2023-03-27 VITALS — BP 110/70 | HR 48 | Ht 67.0 in | Wt 186.0 lb

## 2023-03-27 DIAGNOSIS — R001 Bradycardia, unspecified: Secondary | ICD-10-CM

## 2023-03-27 DIAGNOSIS — R030 Elevated blood-pressure reading, without diagnosis of hypertension: Secondary | ICD-10-CM

## 2023-03-27 NOTE — Patient Instructions (Signed)
Medication Instructions:  No changes *If you need a refill on your cardiac medications before your next appointment, please call your pharmacy*   Lab Work: none If you have labs (blood work) drawn today and your tests are completely normal, you will receive your results only by: MyChart Message (if you have MyChart) OR A paper copy in the mail If you have any lab test that is abnormal or we need to change your treatment, we will call you to review the results.   Testing/Procedures: Your physician has requested that you have an echocardiogram. Echocardiography is a painless test that uses sound waves to create images of your heart. It provides your doctor with information about the size and shape of your heart and how well your heart's chambers and valves are working. This procedure takes approximately one hour. There are no restrictions for this procedure. Please do NOT wear cologne, perfume, aftershave, or lotions (deodorant is allowed). Please arrive 15 minutes prior to your appointment time.   Follow-Up: As needed

## 2023-04-09 ENCOUNTER — Ambulatory Visit (HOSPITAL_COMMUNITY): Payer: BC Managed Care – PPO | Attending: Internal Medicine

## 2023-04-09 DIAGNOSIS — R030 Elevated blood-pressure reading, without diagnosis of hypertension: Secondary | ICD-10-CM | POA: Insufficient documentation

## 2023-04-09 DIAGNOSIS — R001 Bradycardia, unspecified: Secondary | ICD-10-CM | POA: Diagnosis present

## 2023-04-09 LAB — ECHOCARDIOGRAM COMPLETE
Area-P 1/2: 2.76 cm2
S' Lateral: 2.5 cm

## 2023-04-12 ENCOUNTER — Telehealth: Payer: Self-pay | Admitting: Internal Medicine

## 2023-04-12 ENCOUNTER — Encounter: Payer: Self-pay | Admitting: Internal Medicine

## 2023-04-12 DIAGNOSIS — R030 Elevated blood-pressure reading, without diagnosis of hypertension: Secondary | ICD-10-CM

## 2023-04-12 MED ORDER — LOSARTAN POTASSIUM 25 MG PO TABS: 25.0000 mg | ORAL_TABLET | Freq: Every day | ORAL | 3 refills | Status: DC

## 2023-04-12 NOTE — Telephone Encounter (Signed)
Reviewed w patient.  She will start losartan 25 mg and return in one week for blood work, 75m f/u scheduled w provider.  Pt voices understanding and agreement.

## 2023-04-12 NOTE — Telephone Encounter (Signed)
Patient is requesting a call back to discuss echo results. 

## 2023-04-12 NOTE — Telephone Encounter (Signed)
-----   Message from Orbie Pyo sent at 04/09/2023 11:59 AM EDT ----- Paula Lee start losartan 25 mg daily, check a BMP in 1 week, and she needs 72-month follow-up with an APP.

## 2023-04-15 ENCOUNTER — Encounter: Payer: Self-pay | Admitting: Internal Medicine

## 2023-04-15 NOTE — Telephone Encounter (Signed)
Error

## 2023-04-18 NOTE — Progress Notes (Signed)
Cardiology Office Note:   Date:  04/19/2023  ID:  Paula Lee, DOB 1961/01/24, MRN [id number] PCP:  Ralene Ok, MD  [facility] Providers Cardiologist:  Alverda Skeans, MD Referring MD: Ralene Ok, MD   Chief Complaint/Reason for Referral: Cardiology follow-up ASSESSMENT:    1. Left ventricular hypertrophy   2. Primary hypertension   3. Bradycardia   4. Medication management     PLAN:   In order of problems listed above: 1.  Left ventricular hypertrophy: Paula Lee likely has masked hypertension.  Her blood pressure today qualifies as stage I hypertension with a diastolic blood pressure of 80 mmHg.  Will start losartan 12.5 mg at bedtime.  Will obtain BMP next week 2.  Primary hypertension: See above 3.  Bradycardia: Asymptomatic; monitor      Dispo: 6 months   Medication Adjustments/Labs and Tests Ordered: Current medicines are reviewed at length with the patient today.  Concerns regarding medicines are outlined above.  The following changes have been made:     Labs/tests ordered: Orders Placed This Encounter  Procedures   Basic metabolic panel    Medication Changes: Meds ordered this encounter  Medications   losartan (COZAAR) 25 MG tablet    Sig: Take 0.5 tablets (12.5 mg total) by mouth at bedtime.    Dispense:  45 tablet    Refill:  1    DOSE DECREASE--PLEASE FILL LATER FOR PATIENT WILL USE SUPPLY ON HAND FIRST    Current medicines are reviewed at length with the patient today.  The patient does not have concerns regarding medicines.  History of Present Illness:      FOCUSED PROBLEM LIST:   GERD Hypothyroidism Preserved ejection fraction with mild left ventricular hypertrophy TTE 2024  December 2022:  The patient is a 62 y.o. female with the indicated medical history here for chest pain.  Patient was seen by her PCP fairly recently with these complaints.  Paula Lee described left-sided chest discomfort.  The area was tender to palpation as well.  Paula Lee denies  any exertional angina, exertional dyspnea, presyncope, or syncope.  Paula Lee recently had her thyroid medication dose increased.  When this happened Paula Lee had a few intermittent palpitations.  The thyroid medication was then decreased back to her pre-existing dose and Paula Lee has not had any recurrence of the symptoms.  Paula Lee has required no emergency room visits or hospitalizations.  Paula Lee is otherwise well and able to do all of her activities of daily living.  Plan: No further workup; follow-up in 1 year if needed.   September 2024: The patient was seen by her primary care provider recently and noted to have a blood pressure of 161/72 with a heart rate of 52.  The patient tells me that day Paula Lee was quite agitated.  For this reason Paula Lee is referred back for further recommendations.   Today the patient's blood pressure is normotensive.  Paula Lee tells me that her blood pressures at home are also in the normal range.  Paula Lee denies any presyncope, syncope, palpitations, paroxysmal active dyspnea, orthopnea.  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  Paula Lee fortunately has not required any emergency room visits or hospitalizations.  Plan: Obtain echocardiogram to evaluate for left ventricular hypertrophy.  October 2024: The patient's echocardiogram demonstrated mild left ventricular hypertrophy in the recommendation for low-dose losartan was issued.  The patient is here to discuss this recommendation further.  The patient is doing well.  Unfortunately Paula Lee has a lot of stress  at home because Paula Lee cares for her mom and this takes a lot of her time.  Paula Lee denies any chest pain or shortness of breath.  Paula Lee has some mild peripheral edema which is unchanged.  Paula Lee denies any periods of nocturnal dyspnea or orthopnea.  Paula Lee is required no emergency room visits or hospitalizations.  Today we discussed the detection of mild left ventricular hypertrophy and what that means in regards to ambient blood pressure outside of the office.            Current Medications: Current Meds  Medication Sig   cetirizine (ZYRTEC) 10 MG tablet Take 10 mg by mouth daily as needed for allergies.   cholecalciferol (VITAMIN D) 1000 units tablet Take 1,000 Units by mouth daily.   ketotifen (ZADITOR) 0.025 % ophthalmic solution 1 drop 2 times daily.   levothyroxine (SYNTHROID, LEVOTHROID) 75 MCG tablet Take 75 mcg by mouth daily before breakfast.   losartan (COZAAR) 25 MG tablet Take 0.5 tablets (12.5 mg total) by mouth at bedtime.      Review of Systems:   Please see the history of present illness.    All other systems reviewed and are negative.      EKGs/Labs/Other Test Reviewed:   EKG: EKG performed September 2024 demonstrates sinus bradycardia and septal infarction pattern  EKG Interpretation Date/Time:    Ventricular Rate:    PR Interval:    QRS Duration:    QT Interval:    QTC Calculation:   R Axis:      Text Interpretation:           Risk Assessment/Calculations:          Physical Exam:   VS:  BP 112/80   Pulse 60   Ht 5\' 7"  (1.702 m)   Wt 186 lb (84.4 kg)   LMP 12/22/2016   BMI 29.13 kg/m        Wt Readings from Last 3 Encounters:  04/19/23 186 lb (84.4 kg)  03/27/23 186 lb (84.4 kg)  06/29/21 191 lb 12.8 oz (87 kg)      GENERAL:  No apparent distress, AOx3 HEENT:  No carotid bruits, +2 carotid impulses, no scleral icterus CAR: RRR no murmurs, gallops, rubs, or thrills RES:  Clear to auscultation bilaterally ABD:  Soft, nontender, nondistended, positive bowel sounds x 4 VASC:  +2 radial pulses, +2 carotid pulses NEURO:  CN 2-12 grossly intact; motor and sensory grossly intact PSYCH:  No active depression or anxiety EXT:  No edema, ecchymosis, or cyanosis  Signed, Orbie Pyo, MD  04/19/2023 3:50 PM    [facility] [address] [place], [place], [place]  [address] Phone: [phone]; Fax: [phone]   Note:  This document was prepared using Dragon voice  recognition software and may include unintentional dictation errors.

## 2023-04-19 ENCOUNTER — Encounter: Payer: Self-pay | Admitting: Internal Medicine

## 2023-04-19 ENCOUNTER — Telehealth: Payer: Self-pay | Admitting: Internal Medicine

## 2023-04-19 ENCOUNTER — Ambulatory Visit: Payer: BC Managed Care – PPO | Attending: Internal Medicine | Admitting: Internal Medicine

## 2023-04-19 ENCOUNTER — Other Ambulatory Visit: Payer: BC Managed Care – PPO

## 2023-04-19 VITALS — BP 112/80 | HR 60 | Ht 67.0 in | Wt 186.0 lb

## 2023-04-19 DIAGNOSIS — R001 Bradycardia, unspecified: Secondary | ICD-10-CM | POA: Diagnosis not present

## 2023-04-19 DIAGNOSIS — Z79899 Other long term (current) drug therapy: Secondary | ICD-10-CM

## 2023-04-19 DIAGNOSIS — I10 Essential (primary) hypertension: Secondary | ICD-10-CM

## 2023-04-19 DIAGNOSIS — I517 Cardiomegaly: Secondary | ICD-10-CM

## 2023-04-19 DIAGNOSIS — R030 Elevated blood-pressure reading, without diagnosis of hypertension: Secondary | ICD-10-CM

## 2023-04-19 MED ORDER — LOSARTAN POTASSIUM 25 MG PO TABS: 12.5000 mg | ORAL_TABLET | Freq: Every day | ORAL | 1 refills | Status: DC

## 2023-04-19 NOTE — Telephone Encounter (Signed)
Pt came back to ask question about losartan, had been sent to pharmacy twice - once for 90 and 2nd - 45 days, please call ASAP so she knows what to do!!! Call cell

## 2023-04-19 NOTE — Telephone Encounter (Signed)
Called the patient back.  She has not picked up the first losartan prescription yet - the one that was #90 of losartan 25 mg daily.  Then after seeing MD today, medication was changed to #45 days of losartan 12.5 mg daily.  I adv the notation on the lower dose was for pharmacy to fill later so that patient may use the 90 day supply up first.  She voices understanding and agreement.

## 2023-04-19 NOTE — Patient Instructions (Addendum)
Medication Instructions:   DECREASE YOUR LOSARTAN TO TAKING 12.5 MG BY MOUTH DAILY AT BEDTIME  *If you need a refill on your cardiac medications before your next appointment, please call your pharmacy*   LAB:  ONE WEEK HERE IN THE OFFICE--BMET    Follow-Up: At [facility], you and your health needs are our priority.  As part of our continuing mission to provide you with exceptional heart care, we have created designated Provider Care Teams.  These Care Teams include your primary Cardiologist (physician) and Advanced Practice Providers (APPs -  Physician Assistants and Nurse Practitioners) who all work together to provide you with the care you need, when you need it.  We recommend signing up for the patient portal called "MyChart".  Sign up information is provided on this After Visit Summary.  MyChart is used to connect with patients for Virtual Visits (Telemedicine).  Patients are able to view lab/test results, encounter notes, upcoming appointments, etc.  Non-urgent messages can be sent to your provider as well.   To learn more about what you can do with MyChart, go to [email or web address].    Your next appointment:   6 month(s)  Provider:   Jari Favre, PA-C, Ronie Spies, PA-C, Robin Searing, NP, Jacolyn Reedy, PA-C, Eligha Bridegroom, NP, Tereso Newcomer, PA-C, or Perlie Gold, PA-C         Other instructions:   Mediterranean Diet A Mediterranean diet is based on the traditions of countries on the [place] Sea. It focuses on eating more: Fruits and vegetables. Whole grains, beans, nuts, and seeds. Heart-healthy fats. These are fats that are good for your heart. It involves eating less: Dairy. Meat and eggs. Processed foods with added sugar, salt, and fat. This type of diet can help prevent certain conditions. It can also improve outcomes if you have a long-term (chronic) disease, such as kidney or heart disease. What are tips for following this plan? Reading food  labels Check packaged foods for: The serving size. For foods such as rice and pasta, the serving size is the amount of cooked product, not dry. The total fat. Avoid foods with saturated fat or trans fat. Added sugars, such as corn syrup. Shopping  Try to have a balanced diet. Buy a variety of foods, such as: Fresh fruits and vegetables. You may be able to get these from local farmers markets. You can also buy them frozen. Grains, beans, nuts, and seeds. Some of these can be bought in bulk. Fresh seafood. Poultry and eggs. Low-fat dairy products. Buy whole ingredients instead of foods that have already been packaged. If you can't get fresh seafood, buy precooked frozen shrimp or canned fish, such as tuna, salmon, or sardines. Stock your pantry so you always have certain foods on hand, such as olive oil, canned tuna, canned tomatoes, rice, pasta, and beans. Cooking Cook foods with extra-virgin olive oil instead of using butter or other vegetable oils. Have meat as a side dish. Have vegetables or grains as your main dish. This means having meat in small portions or adding small amounts of meat to foods like pasta or stew. Use beans or vegetables instead of meat in common dishes like chili or lasagna. Try out different cooking methods. Try roasting, broiling, steaming, and sauting vegetables. Add frozen vegetables to soups, stews, pasta, or rice. Add nuts or seeds for added healthy fats and plant protein at each meal. You can add these to yogurt, salads, or vegetable dishes. Marinate fish or vegetables using olive oil,  lemon juice, garlic, and fresh herbs. Meal planning Plan to eat a vegetarian meal one day each week. Try to work up to two vegetarian meals, if possible. Eat seafood two or more times a week. Have healthy snacks on hand. These may include: Vegetable sticks with hummus. Greek yogurt. Fruit and nut trail mix. Eat balanced meals. These should include: Fruit: 2-3 servings a  day. Vegetables: 4-5 servings a day. Low-fat dairy: 2 servings a day. Fish, poultry, or lean meat: 1 serving a day. Beans and legumes: 2 or more servings a week. Nuts and seeds: 1-2 servings a day. Whole grains: 6-8 servings a day. Extra-virgin olive oil: 3-4 servings a day. Limit red meat and sweets to just a few servings a month. Lifestyle  Try to cook and eat meals with your family. Drink enough fluid to keep your pee (urine) pale yellow. Be active every day. This includes: Aerobic exercise, which is exercise that causes your heart to beat faster. Examples include running and swimming. Leisure activities like gardening, walking, or housework. Get 7-8 hours of sleep each night. Drink red wine if your provider says you can. A glass of wine is 5 oz (150 mL). You may be allowed to have: Up to 1 glass a day if you're female and not pregnant. Up to 2 glasses a day if you're female. What foods should I eat? Fruits Apples. Apricots. Avocado. Berries. Bananas. Cherries. Dates. Figs. Grapes. Lemons. Melon. Oranges. Peaches. Plums. Pomegranate. Vegetables Artichokes. Beets. Broccoli. Cabbage. Carrots. Eggplant. Green beans. Chard. Kale. Spinach. Onions. Leeks. Peas. Squash. Tomatoes. Peppers. Radishes. Grains Whole-grain pasta. Brown rice. Bulgur wheat. Polenta. Couscous. Whole-wheat bread. Orpah Cobb. Meats and other proteins Beans. Almonds. Sunflower seeds. Pine nuts. Peanuts. Cod. Salmon. Scallops. Shrimp. Tuna. Tilapia. Clams. Oysters. Eggs. Chicken or [place] without skin. Dairy Low-fat milk. Cheese. Greek yogurt. Fats and oils Extra-virgin olive oil. Avocado oil. Grapeseed oil. Beverages Water. Red wine. Herbal tea. Sweets and desserts Greek yogurt with honey. Baked apples. Poached pears. Trail mix. Seasonings and condiments Basil. Cilantro. Coriander. Cumin. Mint. Parsley. Sage. Rosemary. Tarragon. Garlic. Oregano. Thyme. Pepper. Balsamic vinegar. Tahini. Hummus. Tomato sauce.  Olives. Mushrooms. The items listed above may not be all the foods and drinks you can have. Talk to a dietitian to learn more. What foods should I limit? This is a list of foods that should be eaten rarely. Fruits Fruit canned in syrup. Vegetables Deep-fried potatoes, like [place] fries. Grains Packaged pasta or rice dishes. Cereal with added sugar. Snacks with added sugar. Meats and other proteins Beef. Pork. Lamb. Chicken or [place] with skin. Hot dogs. Tomasa Blase. Dairy Ice cream. Sour cream. Whole milk. Fats and oils Butter. Canola oil. Vegetable oil. Beef fat (tallow). Lard. Beverages Juice. Sugar-sweetened soft drinks. Beer. Liquor and spirits. Sweets and desserts Cookies. Cakes. Pies. Candy. Seasonings and condiments Mayonnaise. Pre-made sauces and marinades. The items listed above may not be all the foods and drinks you should limit. Talk to a dietitian to learn more. Where to find more information American Heart Association (AHA): heart.org This information is not intended to replace advice given to you by your health care provider. Make sure you discuss any questions you have with your health care provider. Document Revised: 10/14/2022 Document Reviewed: 10/14/2022 Elsevier Patient Education  2024 [employer].

## 2023-04-22 ENCOUNTER — Ambulatory Visit: Payer: BC Managed Care – PPO | Admitting: Family Medicine

## 2023-04-23 ENCOUNTER — Encounter: Payer: Self-pay | Admitting: Internal Medicine

## 2023-04-24 ENCOUNTER — Ambulatory Visit: Payer: BC Managed Care – PPO | Admitting: General Practice

## 2023-04-26 ENCOUNTER — Ambulatory Visit: Payer: BC Managed Care – PPO | Attending: Internal Medicine

## 2023-04-26 ENCOUNTER — Encounter: Payer: Self-pay | Admitting: Internal Medicine

## 2023-04-26 DIAGNOSIS — R001 Bradycardia, unspecified: Secondary | ICD-10-CM

## 2023-04-26 DIAGNOSIS — I517 Cardiomegaly: Secondary | ICD-10-CM

## 2023-04-26 DIAGNOSIS — I10 Essential (primary) hypertension: Secondary | ICD-10-CM

## 2023-04-26 DIAGNOSIS — Z79899 Other long term (current) drug therapy: Secondary | ICD-10-CM

## 2023-04-26 LAB — BASIC METABOLIC PANEL
BUN/Creatinine Ratio: 15 (ref 12–28)
BUN: 13 mg/dL (ref 8–27)
CO2: 25 mmol/L (ref 20–29)
Calcium: 9.1 mg/dL (ref 8.7–10.3)
Chloride: 103 mmol/L (ref 96–106)
Creatinine, Ser: 0.86 mg/dL (ref 0.57–1.00)
Glucose: 81 mg/dL (ref 70–99)
Potassium: 4.2 mmol/L (ref 3.5–5.2)
Sodium: 141 mmol/L (ref 134–144)
eGFR: 77 mL/min/{1.73_m2} (ref >59)

## 2023-04-29 ENCOUNTER — Telehealth: Payer: Self-pay | Admitting: Internal Medicine

## 2023-04-29 NOTE — Addendum Note (Signed)
Addended by: [name] on: 04/29/2023 02:53 PM   Modules accepted: Orders

## 2023-04-29 NOTE — Telephone Encounter (Signed)
Pt c/o medication issue:  1. Name of Medication:   losartan (COZAAR) 25 MG tablet   2. How are you currently taking this medication (dosage and times per day)?   As prescribed  3. Are you having a reaction (difficulty breathing--STAT)?   Nauseous, back pain, headache, muscle pain in legs, dizziness  4. What is your medication issue?   Patient stated she took this medication for 7 days and had blood work done but had to stop due to the side effects.  Patient want a call back to discuss next steps.

## 2023-04-30 MED ORDER — LISINOPRIL 10 MG PO TABS: 10.0000 mg | ORAL_TABLET | Freq: Every day | ORAL | 3 refills | Status: DC

## 2023-04-30 NOTE — Telephone Encounter (Signed)
Spoke with patient concerning blood pressure medication, sent in lisinopril 10 mg daily per Dr Lynnette Caffey recommendation. Patient states her blood pressure has been running slightly elevated since discontinuing losartan, systolic around 130. Patient will start lisinopril today, grateful for the quick follow up.     Paula Pyo, MD  to [name], RN  AT   04/29/23  4:41 PM We can try lisinopril 10mg 

## 2023-04-30 NOTE — Telephone Encounter (Signed)
Patient is calling to follow up on phone encounter (10/14) and patient message (10/11). Please advise.

## 2023-04-30 NOTE — Telephone Encounter (Signed)
Addressed via telephone encounter today 04/30/23.

## 2023-05-01 MED ORDER — AMLODIPINE BESYLATE 5 MG PO TABS: 5.0000 mg | ORAL_TABLET | Freq: Every day | ORAL | 3 refills | Status: DC

## 2023-05-01 NOTE — Telephone Encounter (Signed)
Orbie Pyo, MD  Loa Socks, LPN; [name], RN Lets try amlodipine 5mg ; please add acei/arb to intolerance list. __________________________________________________________ Updated allergy list and sent amlodipine to [facility].  Updated patient via patient portal.

## 2023-05-01 NOTE — Addendum Note (Signed)
Addended by: [name] on: 05/01/2023 01:17 PM   Modules accepted: Orders

## 2023-05-07 NOTE — Progress Notes (Unsigned)
Cardiology Office Note:   Date:  05/08/2023  ID:  Paula Lee, DOB [birth year], MRN [id number] PCP:  Ralene Ok, MD  [facility] Providers Cardiologist:  Alverda Skeans, MD Referring MD: Ralene Ok, MD  Chief Complaint/Reason for Referral:  Cardiology follow up ASSESSMENT:    1. Precordial pain   2. Palpitations   3. Left ventricular hypertrophy   4. Primary hypertension     PLAN:   In order of problems listed above: Chest pain: Atypical in nature.  Continue to monitor.  The patient has tenderness to palpation of her chest wall.  Will defer ischemic evaluation at this time. Palpitations: Resolved. Left ventricular hypertrophy: Restart losartan 12.5 mg at bedtime.   Hypertension: Discontinue amlodipine as she has not even started this and restart losartan 12.5 mg at bedtime.            Dispo:  Return in about 3 months (around 08/08/2023).      Medication Adjustments/Labs and Tests Ordered: Current medicines are reviewed at length with the patient today.  Concerns regarding medicines are outlined above.  The following changes have been made:     Labs/tests ordered: No orders of the defined types were placed in this encounter.   Medication Changes: Meds ordered this encounter  Medications   losartan (COZAAR) 25 MG tablet    Sig: Take 0.5 tablets (12.5 mg total) by mouth daily.    Dispense:  45 tablet    Refill:  2    Patient to stop amlodipine    Current medicines are reviewed at length with the patient today.  The patient does not have concerns regarding medicines.  I spent 32 minutes reviewing all clinical data during and prior to this visit including all relevant imaging studies, laboratories, clinical information from other health systems, and prior notes from both Cardiology and other specialties, interviewing the patient, and conducting a complete physical examination in order to formulate a comprehensive and personalized evaluation and treatment  plan.  History of Present Illness:      FOCUSED PROBLEM LIST:   GERD Hypothyroidism Left ventricular hypertrophy TTE 2024  December 2022:  The patient is a 62 y.o. female with the indicated medical history here for chest pain.  Patient was seen by her PCP fairly recently with these complaints.  She described left-sided chest discomfort.  The area was tender to palpation as well.  She denies any exertional angina, exertional dyspnea, presyncope, or syncope.  She recently had her thyroid medication dose increased.  When this happened she had a few intermittent palpitations.  The thyroid medication was then decreased back to her pre-existing dose and she has not had any recurrence of the symptoms.  She has required no emergency room visits or hospitalizations.  She is otherwise well and able to do all of her activities of daily living.  Plan: No further workup; follow-up in 1 year if needed.   September 2024: The patient was seen by her primary care provider recently and noted to have a blood pressure of 161/72 with a heart rate of 52.  The patient tells me that day she was quite agitated.  For this reason she is referred back for further recommendations.   Today the patient's blood pressure is normotensive.  She tells me that her blood pressures at home are also in the normal range.  She denies any presyncope, syncope, palpitations, paroxysmal active dyspnea, orthopnea.  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  She  fortunately has not required any emergency room visits or hospitalizations.  Plan: Obtain echocardiogram to evaluate for left ventricular hypertrophy.  04/19/23: The patient's echocardiogram demonstrated mild left ventricular hypertrophy in the recommendation for low-dose losartan was issued.  The patient is here to discuss this recommendation further.  The patient is doing well.  Unfortunately she has a lot of stress at home because she cares for her mom and this takes  a lot of her time.  She denies any chest pain or shortness of breath.  She has some mild peripheral edema which is unchanged.  She denies any periods of nocturnal dyspnea or orthopnea.  She is required no emergency room visits or hospitalizations.  Today we discussed the detection of mild left ventricular hypertrophy and what that means in regards to ambient blood pressure outside of the office.  Plan: Start losartan 12.5 mg and check BMP in 1 week.  05/07/2023: In the interim the patient could not tolerate losartan.  She is eventually transitioned to amlodipine 5 mg.  She was seen in urgent care earlier this week due to palpitations, chest discomfort and nausea.  Apparently she was cooking and she felt a sharp chest discomfort of 6 out of 10 severity rating down her neck back and left arm.  Her blood pressure at urgent care was 148/98.  EKG demonstrated no acute ischemic changes.  The patient tells me that after she started losartan she developed the neck pain.  She contacted our office and this was stopped.  Amlodipine was started.  She has not started the amlodipine.  She stopped the losartan and she continued to complain of the neck pain.  She also has leg pain and chest pain.  The chest pain is constant.  It is exacerbated by palpation.  This is the chest pain that prompted her to be seen by urgent care.            Current Medications: Current Meds  Medication Sig   cetirizine (ZYRTEC) 10 MG tablet Take 10 mg by mouth daily as needed for allergies.   cholecalciferol (VITAMIN D) 1000 units tablet Take 1,000 Units by mouth daily.   ketotifen (ZADITOR) 0.025 % ophthalmic solution 1 drop 2 times daily.   levothyroxine (SYNTHROID, LEVOTHROID) 75 MCG tablet Take 75 mcg by mouth daily before breakfast.   losartan (COZAAR) 25 MG tablet Take 0.5 tablets (12.5 mg total) by mouth daily.   [DISCONTINUED] amLODipine (NORVASC) 5 MG tablet Take 1 tablet (5 mg total) by mouth daily.     Review of Systems:    Please see the history of present illness.    All other systems reviewed and are negative.     EKGs/Labs/Other Test Reviewed:   EKG: EKG performed September 2024 demonstrates sinus bradycardia with septal infarction pattern  EKG Interpretation Date/Time:    Ventricular Rate:    PR Interval:    QRS Duration:    QT Interval:    QTC Calculation:   R Axis:      Text Interpretation:           Risk Assessment/Calculations:          Physical Exam:   VS:  BP 135/85   Pulse 61   Ht 5\' 7"  (1.702 m)   Wt 183 lb 6.4 oz (83.2 kg)   LMP 12/22/2016   SpO2 98%   BMI 28.72 kg/m        Wt Readings from Last 3 Encounters:  05/08/23 183 lb 6.4 oz (  83.2 kg)  04/19/23 186 lb (84.4 kg)  03/27/23 186 lb (84.4 kg)      GENERAL:  No apparent distress, AOx3 HEENT:  No carotid bruits, +2 carotid impulses, no scleral icterus CAR: RRR no murmurs, gallops, rubs, or thrills RES:  Clear to auscultation bilaterally ABD:  Soft, nontender, nondistended, positive bowel sounds x 4 VASC:  +2 radial pulses, +2 carotid pulses NEURO:  CN 2-12 grossly intact; motor and sensory grossly intact PSYCH:  No active depression or anxiety EXT:  No edema, ecchymosis, or cyanosis  Signed, Orbie Pyo, MD  05/08/2023 3:08 PM    [facility] [address] [place], [place], [place]  [address] Phone: [phone]; Fax: [phone]   Note:  This document was prepared using Dragon voice recognition software and may include unintentional dictation errors.

## 2023-05-08 ENCOUNTER — Ambulatory Visit: Payer: BC Managed Care – PPO | Attending: Internal Medicine | Admitting: Internal Medicine

## 2023-05-08 ENCOUNTER — Encounter: Payer: Self-pay | Admitting: Internal Medicine

## 2023-05-08 VITALS — BP 135/85 | HR 61 | Ht 67.0 in | Wt 183.4 lb

## 2023-05-08 DIAGNOSIS — I517 Cardiomegaly: Secondary | ICD-10-CM | POA: Diagnosis not present

## 2023-05-08 DIAGNOSIS — I10 Essential (primary) hypertension: Secondary | ICD-10-CM

## 2023-05-08 DIAGNOSIS — R072 Precordial pain: Secondary | ICD-10-CM

## 2023-05-08 DIAGNOSIS — R002 Palpitations: Secondary | ICD-10-CM

## 2023-05-08 MED ORDER — LOSARTAN POTASSIUM 25 MG PO TABS: 12.5000 mg | ORAL_TABLET | Freq: Every day | ORAL | 2 refills | Status: DC

## 2023-05-08 NOTE — Patient Instructions (Signed)
Medication Instructions:  Your physician has recommended you make the following change in your medication:  Stop amlodipine  Start Losartan 12.5 mg by  mouth daily at bedtime.   *If you need a refill on your cardiac medications before your next appointment, please call your pharmacy*   Lab Work: none If you have labs (blood work) drawn today and your tests are completely normal, you will receive your results only by: MyChart Message (if you have MyChart) OR A paper copy in the mail If you have any lab test that is abnormal or we need to change your treatment, we will call you to review the results.   Testing/Procedures: none   Follow-Up: At [facility], you and your health needs are our priority.  As part of our continuing mission to provide you with exceptional heart care, we have created designated Provider Care Teams.  These Care Teams include your primary Cardiologist (physician) and Advanced Practice Providers (APPs -  Physician Assistants and Nurse Practitioners) who all work together to provide you with the care you need, when you need it.  We recommend signing up for the patient portal called "MyChart".  Sign up information is provided on this After Visit Summary.  MyChart is used to connect with patients for Virtual Visits (Telemedicine).  Patients are able to view lab/test results, encounter notes, upcoming appointments, etc.  Non-urgent messages can be sent to your provider as well.   To learn more about what you can do with MyChart, go to [email or web address].    Your next appointment:   3 month(s)  Provider:   Jari Favre, PA-C, Ronie Spies, PA-C, Robin Searing, NP, Jacolyn Reedy, PA-C, Eligha Bridegroom, NP, Tereso Newcomer, PA-C, or Perlie Gold, PA-C         Other Instructions

## 2023-05-10 ENCOUNTER — Other Ambulatory Visit: Payer: Self-pay | Admitting: Internal Medicine

## 2023-05-10 ENCOUNTER — Encounter: Payer: Self-pay | Admitting: Internal Medicine

## 2023-05-10 ENCOUNTER — Telehealth: Payer: Self-pay | Admitting: Internal Medicine

## 2023-05-10 ENCOUNTER — Ambulatory Visit
Admission: RE | Admit: 2023-05-10 | Discharge: 2023-05-10 | Disposition: A | Discharge status: Home / Self Care | Payer: BC Managed Care – PPO | Source: Ambulatory Visit | Attending: Internal Medicine | Admitting: Internal Medicine

## 2023-05-10 DIAGNOSIS — M542 Cervicalgia: Secondary | ICD-10-CM

## 2023-05-10 NOTE — Telephone Encounter (Signed)
Pt states she would like to switch from Dr. Lynnette Caffey to Dr. Duke Salvia. Please advise

## 2023-05-12 NOTE — Progress Notes (Unsigned)
Cardiology Office Note:  .   Date:  05/13/2023  ID:  Paula Lee, DOB [birth year], MRN [id number] PCP: Ralene Ok, MD  [facility] Providers Cardiologist:  Dr. Chilton Si   }   History of Present Illness: .   Paula Lee is a 62 y.o. female with history of noncardiac chest discomfort, pain is reproducible with palpation, palpitations, hypertension.  Was last seen by cardiology on 05/08/2023 at which time losartan 12.5 mg was prescribed at bedtime, she had been prescribed amlodipine but had not taken it yet.  She is recently requested to be established with Dr. Chilton Si.  She is here for follow-up concerning her blood pressure control.  She brings with her a list of her blood pressures over the last 2 weeks.  Labile blood pressures are noted highest 156/90, lowest 112/68.  The patient remained bradycardic in the 50s and 60s.  She admits to eating high salt foods, fast foods, and not doing any purposeful exercise.  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  She continues to have pressure in her chest that is somewhat reproducible but not always.  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  She is concerned about her blood pressure going up and down so much.  She has never been tested for sleep apnea.  She does have hypothyroidism and is being followed by PCP with medication adjustments.  Most recent labs reviewed from August 2024 revealed normal TSH.  ROS: As above otherwise negative.  Studies Reviewed: .      Echocardiogram 04/09/2023 1. Left ventricular ejection fraction, by estimation, is 60 to 65%. Left  ventricular ejection fraction by 3D volume is 64 %. The left ventricle has  normal function. The left ventricle has no regional wall motion  abnormalities. There is mild left  ventricular hypertrophy. Left ventricular diastolic parameters were  normal. The average left ventricular global longitudinal strain is  -24.3  %. The global longitudinal strain is normal.   2. Right ventricular systolic function is normal. The right ventricular  size is normal. There is normal pulmonary artery systolic pressure. The  estimated right ventricular systolic pressure is 24.7 mmHg.   3. The mitral valve is grossly normal. Mild mitral valve regurgitation.  No evidence of mitral stenosis.   4. The aortic valve is tricuspid. Aortic valve regurgitation is not  visualized. No aortic stenosis is present.   5. The inferior vena cava is normal in size with greater than 50%  respiratory variability, suggesting right atrial pressure of 3 mmHg.    Physical Exam:   VS:  BP 138/84   Pulse (!) 56   Ht 5\' 7"  (1.702 m)   Wt 185 lb 12.8 oz (84.3 kg)   LMP 12/22/2016   SpO2 98%   BMI 29.10 kg/m    Wt Readings from Last 3 Encounters:  05/13/23 185 lb 12.8 oz (84.3 kg)  05/08/23 183 lb 6.4 oz (83.2 kg)  04/19/23 186 lb (84.4 kg)    GEN: Well nourished, well developed in no acute distress NECK: No JVD; No carotid bruits CARDIAC: RRR bradycardic, no murmurs, rubs, gallops RESPIRATORY:  Clear to auscultation without rales, wheezing or rhonchi  ABDOMEN: Soft, non-tender, non-distended EXTREMITIES: Bilateral nonpitting edema; No deformity   ASSESSMENT AND PLAN: .    Hypertension: Intolerant to ARB's and ACE inhibitors due to worsening myalgia pain.  She was afraid to take amlodipine.  Blood  pressures have been labile.  Due to her significant intolerance to medications I am just going to start her on a low-dose of HCTZ 12.5 mg daily.  She is to keep up with her blood pressures at home, salt avoidance would be helpful, along with purposeful exercise.  Going see her back in a month to see how well she does on this medication.  Will need follow-up BMET.  She will let us know if she is not tolerating the medication.  Based upon her blood pressure readings it does not appear that she needs a very high dose of any antihypertensives at  this time.  I also discussed the results of her echocardiogram and answered questions.  2.  Chronic musculoskeletal pain.  Has not been officially diagnosed with fibromyalgia or neuropathic pain.  She has chronic neck and shoulder pain being followed by PCP .  3.  Hypothyroidism: Followed by PCP.  Recommend follow-up TSH.  Last TSH in August was normal.  4.  Chest pressure: I am going to order nuclear medicine stress test for diagnostic prognostic purposes and to evaluate for chronotropic incompetence in the setting of bradycardia.  This has been explained to her and she is willing to move forward.       Signed, Bettey Mare. Liborio Nixon, ANP, AACC

## 2023-05-13 ENCOUNTER — Encounter: Payer: Self-pay | Admitting: Adult Health

## 2023-05-13 ENCOUNTER — Ambulatory Visit: Payer: BC Managed Care – PPO | Attending: Adult Health | Admitting: Adult Health

## 2023-05-13 VITALS — BP 138/84 | HR 56 | Ht 67.0 in | Wt 185.8 lb

## 2023-05-13 DIAGNOSIS — I10 Essential (primary) hypertension: Secondary | ICD-10-CM | POA: Diagnosis not present

## 2023-05-13 DIAGNOSIS — R001 Bradycardia, unspecified: Secondary | ICD-10-CM | POA: Diagnosis not present

## 2023-05-13 DIAGNOSIS — R072 Precordial pain: Secondary | ICD-10-CM

## 2023-05-13 MED ORDER — HYDROCHLOROTHIAZIDE 12.5 MG PO CAPS: 12.5000 mg | ORAL_CAPSULE | Freq: Every day | ORAL | 3 refills | Status: DC

## 2023-05-13 NOTE — Patient Instructions (Signed)
Medication Instructions:  Start hydrochlorothiazide 12.5 mg ( Take 1 Tablet Daily). *If you need a refill on your cardiac medications before your next appointment, please call your pharmacy*   Lab Work: No Labs If you have labs (blood work) drawn today and your tests are completely normal, you will receive your results only by: MyChart Message (if you have MyChart) OR A paper copy in the mail If you have any lab test that is abnormal or we need to change your treatment, we will call you to review the results.   Testing/Procedures: [address]. Your physician has requested that you have an exercise tolerance test. For further information please visit [email or web address] Please also follow instruction sheet, as given.    Follow-Up: At [facility], you and your health needs are our priority.  As part of our continuing mission to provide you with exceptional heart care, we have created designated Provider Care Teams.  These Care Teams include your primary Cardiologist (physician) and Advanced Practice Providers (APPs -  Physician Assistants and Nurse Practitioners) who all work together to provide you with the care you need, when you need it.  We recommend signing up for the patient portal called "MyChart".  Sign up information is provided on this After Visit Summary.  MyChart is used to connect with patients for Virtual Visits (Telemedicine).  Patients are able to view lab/test results, encounter notes, upcoming appointments, etc.  Non-urgent messages can be sent to your provider as well.   To learn more about what you can do with MyChart, go to [email or web address].    Your next appointment:   1 month(s)  Provider:   Joni Reining, DNP, ANP

## 2023-05-15 ENCOUNTER — Encounter (HOSPITAL_BASED_OUTPATIENT_CLINIC_OR_DEPARTMENT_OTHER): Payer: Self-pay

## 2023-05-15 NOTE — Telephone Encounter (Signed)
Returned call to patient. She stated she is aware that Dr. Duke Salvia is fine with seeing her due to provider switch. Told patient Dr. Duke Salvia is currently booked out into March 2025; placed a 6 month recall for patient and sent her a MyChart message to call office within mid January / early Feb to see if she could get an earlier appointment.

## 2023-05-15 NOTE — Telephone Encounter (Signed)
Pt trying to schedule with Chilton Si. Please advise

## 2023-05-21 ENCOUNTER — Telehealth (HOSPITAL_COMMUNITY): Payer: Self-pay

## 2023-05-21 NOTE — Telephone Encounter (Signed)
Spoke with the patient, detailed instructions given. Asked to call back with any questions. S.Euan Wandler CCT

## 2023-05-28 ENCOUNTER — Telehealth: Payer: Self-pay

## 2023-05-28 ENCOUNTER — Ambulatory Visit (HOSPITAL_COMMUNITY): Payer: BC Managed Care – PPO

## 2023-05-28 DIAGNOSIS — R072 Precordial pain: Secondary | ICD-10-CM

## 2023-05-28 NOTE — Telephone Encounter (Signed)
Contacted pt to address concerns. Pt already discussing concerns with Dennis Bast, RN Clinical Supervisor, at the time of call.

## 2023-05-28 NOTE — Telephone Encounter (Signed)
Called patient to advise appointment for Stress test canceled . Patient had requested to speak to supervisor . Information given to [facility].

## 2023-05-29 ENCOUNTER — Encounter (HOSPITAL_COMMUNITY): Payer: Self-pay

## 2023-05-30 ENCOUNTER — Ambulatory Visit (HOSPITAL_COMMUNITY): Payer: BC Managed Care – PPO | Attending: Cardiovascular Disease

## 2023-05-30 VITALS — Ht 67.0 in | Wt 185.0 lb

## 2023-05-30 DIAGNOSIS — R072 Precordial pain: Secondary | ICD-10-CM | POA: Diagnosis present

## 2023-05-30 LAB — MYOCARDIAL PERFUSION IMAGING
LV dias vol: 63 mL (ref 46–106)
LV sys vol: 20 mL
Nuc Stress EF: 69 %
Peak HR: 100 {beats}/min
Rest HR: 51 {beats}/min
Rest Nuclear Isotope Dose: 10.9 mCi
SDS: 0
SRS: 0
SSS: 0
ST Depression (mm): 0 mm
Stress Nuclear Isotope Dose: 31.7 mCi
TID: 1.1

## 2023-05-30 MED ORDER — TECHNETIUM TC 99M TETROFOSMIN IV KIT
10.9000 | PACK | Freq: Once | INTRAVENOUS | Status: AC | PRN
  Administered 2023-05-30: 10.9 via INTRAVENOUS

## 2023-05-30 MED ORDER — TECHNETIUM TC 99M TETROFOSMIN IV KIT
31.7000 | PACK | Freq: Once | INTRAVENOUS | Status: AC | PRN
  Administered 2023-05-30: 31.7 via INTRAVENOUS

## 2023-05-30 MED ORDER — REGADENOSON 0.4 MG/5ML IV SOLN
0.4000 mg | Freq: Once | INTRAVENOUS | Status: AC
  Administered 2023-05-30: 0.4 mg via INTRAVENOUS

## 2023-05-31 ENCOUNTER — Telehealth: Payer: Self-pay

## 2023-05-31 NOTE — Telephone Encounter (Addendum)
Called patient regarding results. Left detailed message for patient regarding results. Letter mailed to patient 05/31/2023----- Message from Joni Reining sent at 05/31/2023  7:30 AM EST ----- Stress test is normal No changes in medication regimen or treatment.

## 2023-06-09 NOTE — Progress Notes (Unsigned)
Cardiology Office Note:  .   Date:  06/12/2023  ID:  Paula Lee, DOB 1961-04-02, MRN [id number] PCP: Ralene Ok, MD  [facility] Providers Cardiologist:  Orbie Pyo, MD  History of Present Illness: .   Paula Lee is a 62 y.o. female with history of noncardiac chest discomfort, pain is reproducible with palpation, palpitations, hypertension, and hypothyroidism.  On last office visit dated 05/13/2023 the patient's blood pressure is very labile.  She was found to be intolerant to ARB's and ACE inhibitors due to worsening myalgia pain.  She was afraid to take amlodipine.  I started her on HCTZ 12.5 mg daily.  She was to avoid salt keep up with her blood pressures and report any new symptoms.    Concerning chronic musculoskeletal pain she was to follow-up with her primary care provider along with ongoing treatment for hypothyroidism.  She was continuing to complain of chest pain.  I did order nuclear medicine stress test..  This was completed on 05/30/2023, and found to have no evidence of ischemia or infarction with a EF of 69%.  The study was normal and low risk.  She comes today doing very well and is tolerating HCTZ without any side effects.  She does "experiment" and not take it to see what her blood pressure does without the medication and there is a significant change.  Blood pressures can get as high as 164/78 when she does not take it.  Currently blood pressure is very well-controlled.  She has not been taking her rosuvastatin consistently as she states is causing some nausea when she takes it.  ROS: As above otherwise negative  Studies Reviewed: .    NM Stress Test 05/30/2023  LV perfusion is normal. There is no evidence of ischemia. There is no evidence of infarction.   Left ventricular function is normal. Nuclear stress EF: 69%. The left ventricular ejection fraction is hyperdynamic (>65%). End diastolic cavity size is normal.   The study is normal. The study is  low risk.     Physical Exam:   VS:  BP 128/82   Pulse (!) 57   Ht 5\' 7"  (1.702 m)   Wt 185 lb 12.8 oz (84.3 kg)   LMP 12/22/2016   SpO2 99%   BMI 29.10 kg/m    Wt Readings from Last 3 Encounters:  06/12/23 185 lb 12.8 oz (84.3 kg)  05/30/23 185 lb (83.9 kg)  05/13/23 185 lb 12.8 oz (84.3 kg)    GEN: Well nourished, well developed in no acute distress NECK: No JVD; No carotid bruits CARDIAC: RRR, bradycardic, no murmurs, rubs, gallops RESPIRATORY:  Clear to auscultation without rales, wheezing or rhonchi  ABDOMEN: Soft, non-tender, non-distended EXTREMITIES:  No edema; No deformity   ASSESSMENT AND PLAN: .    Hypertension: Well-controlled on HCTZ 12.5 mg daily.  She is to take it consistently.  She is also not to take her blood pressure several times during the day but only once a day.  I have advised her on salty foods especially over the holiday season.  Of her blood pressure is elevated over 150/80, in the afternoon after eating a high salt meal during the holidays she may take an additional 12.5 mg.  Will do a be met today for medication evaluation  2.  Hypercholesterolemia: Not taking rosuvastatin consistently as she states is causing nausea.  I have asked her to try taking it with food to see if this would mitigate this symptom.  Will see her back for follow-up labs.  Exercise and weight loss are recommended.  She has brought this up for self and is planning on beginning an exercise program.     3.  Hypothyroidism: She has follow-up with PCP as she is bradycardic to evaluate level and adjust medications if required.   Signed, Bettey Mare. Liborio Nixon, ANP, AACC

## 2023-06-12 ENCOUNTER — Encounter: Payer: Self-pay | Admitting: Adult Health

## 2023-06-12 ENCOUNTER — Ambulatory Visit: Payer: BC Managed Care – PPO | Attending: Adult Health | Admitting: Adult Health

## 2023-06-12 VITALS — BP 128/82 | HR 57 | Ht 67.0 in | Wt 185.8 lb

## 2023-06-12 DIAGNOSIS — Z79899 Other long term (current) drug therapy: Secondary | ICD-10-CM

## 2023-06-12 DIAGNOSIS — I10 Essential (primary) hypertension: Secondary | ICD-10-CM | POA: Diagnosis not present

## 2023-06-12 DIAGNOSIS — E7800 Pure hypercholesterolemia, unspecified: Secondary | ICD-10-CM

## 2023-06-12 DIAGNOSIS — E039 Hypothyroidism, unspecified: Secondary | ICD-10-CM | POA: Diagnosis not present

## 2023-06-12 LAB — BASIC METABOLIC PANEL
BUN/Creatinine Ratio: 14 (ref 12–28)
BUN: 12 mg/dL (ref 8–27)
CO2: 26 mmol/L (ref 20–29)
Calcium: 9.4 mg/dL (ref 8.7–10.3)
Chloride: 102 mmol/L (ref 96–106)
Creatinine, Ser: 0.84 mg/dL (ref 0.57–1.00)
Glucose: 76 mg/dL (ref 70–99)
Potassium: 4.5 mmol/L (ref 3.5–5.2)
Sodium: 142 mmol/L (ref 134–144)
eGFR: 80 mL/min/{1.73_m2} (ref >59)

## 2023-06-12 MED ORDER — HYDROCHLOROTHIAZIDE 12.5 MG PO CAPS: 12.5000 mg | ORAL_CAPSULE | Freq: Every day | ORAL | 4 refills | Status: DC

## 2023-06-12 NOTE — Patient Instructions (Signed)
Medication Instructions:  No Changes *If you need a refill on your cardiac medications before your next appointment, please call your pharmacy*   Lab Work: BMET If you have labs (blood work) drawn today and your tests are completely normal, you will receive your results only by: MyChart Message (if you have MyChart) OR A paper copy in the mail If you have any lab test that is abnormal or we need to change your treatment, we will call you to review the results.   Testing/Procedures: No Testing   Follow-Up: At [facility], you and your health needs are our priority.  As part of our continuing mission to provide you with exceptional heart care, we have created designated Provider Care Teams.  These Care Teams include your primary Cardiologist (physician) and Advanced Practice Providers (APPs -  Physician Assistants and Nurse Practitioners) who all work together to provide you with the care you need, when you need it.  We recommend signing up for the patient portal called "MyChart".  Sign up information is provided on this After Visit Summary.  MyChart is used to connect with patients for Virtual Visits (Telemedicine).  Patients are able to view lab/test results, encounter notes, upcoming appointments, etc.  Non-urgent messages can be sent to your provider as well.   To learn more about what you can do with MyChart, go to [email or web address].    Your next appointment:   6 month(s)  Provider:   Joni Reining, DNP, ANP   or, Orbie Pyo, MD

## 2023-06-18 ENCOUNTER — Telehealth: Payer: Self-pay

## 2023-06-18 NOTE — Telephone Encounter (Addendum)
Called patient regarding results. Patient had understanding of results.----- Message from Joni Reining sent at 06/12/2023  4:55 PM EST ----- All labs are normal.  No changes in regimen or plans.

## 2023-07-08 ENCOUNTER — Ambulatory Visit: Payer: BC Managed Care – PPO | Admitting: Internal Medicine

## 2023-08-15 ENCOUNTER — Ambulatory Visit: Payer: BC Managed Care – PPO | Admitting: Nurse Practitioner

## 2023-08-28 ENCOUNTER — Telehealth: Payer: Self-pay

## 2023-08-28 ENCOUNTER — Ambulatory Visit
Admission: EM | Admit: 2023-08-28 | Discharge: 2023-08-28 | Disposition: A | Discharge status: Home / Self Care | Payer: 59 | Attending: Family Medicine | Admitting: Family Medicine

## 2023-08-28 DIAGNOSIS — J101 Influenza due to other identified influenza virus with other respiratory manifestations: Secondary | ICD-10-CM

## 2023-08-28 LAB — POC COVID19/FLU A&B COMBO
Covid Antigen, POC: NEGATIVE
Influenza A Antigen, POC: POSITIVE — AB
Influenza B Antigen, POC: NEGATIVE

## 2023-08-28 MED ORDER — BENZONATATE 100 MG PO CAPS: 100.0000 mg | ORAL_CAPSULE | Freq: Three times a day (TID) | ORAL | 0 refills | Status: DC

## 2023-08-28 MED ORDER — BENZONATATE 200 MG PO CAPS: 200.0000 mg | ORAL_CAPSULE | Freq: Three times a day (TID) | ORAL | 0 refills | Status: DC | PRN

## 2023-08-28 NOTE — Discharge Instructions (Signed)
You have tested positive for influenza A.  Please take Tessalon 3 times a day as needed for your cough.  May take Tylenol or ibuprofen over-the-counter as needed for fever management lots of fluids and rest.  Please follow-up with your PCP 2 to 3 days for recheck.  Please go to the ER if you develop any worsening symptoms.  Hope you feel better soon!

## 2023-08-28 NOTE — ED Triage Notes (Signed)
Pt presents to UC for c/o dry cough, headache, nasal pressure x3 days. Taking honey and onions

## 2023-08-28 NOTE — ED Provider Notes (Signed)
[facility]    CSN: [id number] Arrival date & time: 08/28/23  0801      History   Chief Complaint No chief complaint on file.   HPI Paula Lee is a 63 y.o. female  presents for evaluation of URI symptoms for 3 days. Patient reports associated symptoms of cough, headache, congestion. Denies N/V/D, fevers, sore throat, ear pain, body aches, shortness of breath. Patient does not have a hx of asthma. Patient is not an active smoker.   Reports no sick contacts.  Pt has taken Robitussin, honey and onions OTC for symptoms.  Patient is vaccinated for influenza.  Pt has no other concerns at this time.   HPI  Past Medical History:  Diagnosis Date   Allergic rhinitis    Allergy    Asthma    Chronic sinusitis    Cyst (solitary) of breast, right    Depression    GERD (gastroesophageal reflux disease)    Hematuria, microscopic    Hyperglycemia    Hypothyroid    Lumbago    Obesity    Onychomycosis    Uterine fibroid     Patient Active Problem List   Diagnosis Date Noted   Asthma 09/02/2018   Thyroid activity decreased 11/11/2015   Seasonal allergies 11/11/2015   Esophageal reflux 11/11/2015   Family history of polyps in the colon 05/02/2015   Post-void dribbling 05/21/2014   Family history of breast cancer in mother 02/05/2014   Menopausal symptoms 05/25/2013   Healthcare maintenance 05/25/2013   Cervical 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      Home Medications    Prior to Admission medications   Medication Sig Start Date End Date Taking? Authorizing Provider  benzonatate (TESSALON) 200 MG capsule Take 1 capsule (200 mg total) by mouth 3 (three) times daily as needed. 08/28/23  Yes Radford Pax, NP   levothyroxine (SYNTHROID, LEVOTHROID) 75 MCG tablet Take 75 mcg by mouth daily before breakfast.   Yes [provider]  cetirizine (ZYRTEC) 10 MG tablet Take 10 mg by mouth daily as needed for allergies.    [provider]  cholecalciferol (VITAMIN D) 1000 units tablet Take 1,000 Units by mouth daily.    [provider]  hydrochlorothiazide (MICROZIDE) 12.5 MG capsule Take 1 capsule (12.5 mg total) by mouth daily. 06/12/23 09/10/23  Jodelle Gross, NP  ketotifen (ZADITOR) 0.025 % ophthalmic solution 1 drop 2 times daily.    [provider]  rosuvastatin (CRESTOR) 5 MG tablet Take 5 mg by mouth 2 (two) times a week. 04/24/23   [provider]    Family History Family History  Problem Relation Age of Onset   Breast cancer Mother    Hypertension Mother    Diabetes Mother    Cancer Mother     Social History Social History   Tobacco Use   Smoking status: Never   Smokeless tobacco: Never  Vaping Use   Vaping status: Never Used  Substance Use Topics   Alcohol use: No    Alcohol/week: 0.0 standard drinks of alcohol   Drug use: No     Allergies   Ace inhibitors, Codeine, Losartan, and Penicillins  Review of Systems Review of Systems  HENT:  Positive for congestion.   Respiratory:  Positive for cough.   Neurological:  Positive for headaches.     Physical Exam Triage Vital Signs ED Triage Vitals  Encounter Vitals Group     BP 08/28/23 0815 132/76     Systolic BP Percentile --      Diastolic BP Percentile --      Pulse Rate 08/28/23 0815 63     Resp 08/28/23 0815 18     Temp 08/28/23 0815 99.4 F (37.4 C)     Temp Source 08/28/23 0815 Oral     SpO2 08/28/23 0815 95 %     Weight --      Height --      Head Circumference --      Peak Flow --      Pain Score 08/28/23 0812 5     Pain Loc --      Pain Education --      Exclude from Growth Chart --    No data found.  Updated Vital Signs BP 132/76 (BP Location: Right  Arm)   Pulse 63   Temp 99.4 F (37.4 C) (Oral)   Resp 18   LMP 12/22/2016   SpO2 95%   Visual Acuity Right Eye Distance:   Left Eye Distance:   Bilateral Distance:    Right Eye Near:   Left Eye Near:    Bilateral Near:     Physical Exam Vitals and nursing note reviewed.  Constitutional:      General: She is not in acute distress.    Appearance: She is well-developed. She is not ill-appearing.  HENT:     Head: Normocephalic and atraumatic.     Right Ear: Tympanic membrane and ear canal normal.     Left Ear: Tympanic membrane and ear canal normal.     Nose: Congestion present.     Mouth/Throat:     Mouth: Mucous membranes are moist.     Pharynx: Oropharynx is clear. Uvula midline. No oropharyngeal exudate or posterior oropharyngeal erythema.     Tonsils: No tonsillar exudate or tonsillar abscesses.  Eyes:     Conjunctiva/sclera: Conjunctivae normal.     Pupils: Pupils are equal, round, and reactive to light.  Cardiovascular:     Rate and Rhythm: Normal rate and regular rhythm.     Heart sounds: Normal heart sounds.  Pulmonary:     Effort: Pulmonary effort is normal.     Breath sounds: Normal breath sounds.  Musculoskeletal:     Cervical back: Normal range of motion and neck supple.  Lymphadenopathy:     Cervical: No cervical adenopathy.  Skin:    General: Skin is warm and dry.  Neurological:     General: No focal deficit present.     Mental Status: She is alert and oriented to person, place, and time.  Psychiatric:        Mood and Affect: Mood normal.        Behavior: Behavior normal.      UC Treatments / Results  Labs (all labs ordered are listed, but only abnormal results are displayed) Labs Reviewed  POC COVID19/FLU A&B COMBO - Abnormal; Notable for the following components:      Result Value   Influenza A Antigen, POC Positive (*)    All other components within normal limits    EKG   Radiology No results found.  Procedures Procedures  (including critical care time)  Medications Ordered in UC Medications - No data to display  Initial Impression / Assessment and Plan / UC Course  I have reviewed the triage vital signs and the nursing notes.  Pertinent labs & imaging results that were available during my care of the patient were reviewed by me and considered in my medical decision making (see chart for details).     Reviewed exam and symptoms with patient.  No red flags.  Positive influenza A.  Discussed viral illness and symptomatic treatment.  Symptom onset greater than 48 hours, outside window for Tamiflu.  Start Tessalon as needed cough.  Encourage fluids and rest.  PCP follow-up 2 to 3 days for recheck.  ER precautions reviewed and patient verbalized understanding. Final Clinical Impressions(s) / UC Diagnoses   Final diagnoses:  Influenza A     Discharge Instructions      You have tested positive for influenza A.  Please take Tessalon 3 times a day as needed for your cough.  May take Tylenol or ibuprofen over-the-counter as needed for fever management lots of fluids and rest.  Please follow-up with your PCP 2 to 3 days for recheck.  Please go to the ER if you develop any worsening symptoms.  Hope you feel better soon!     ED Prescriptions     Medication Sig Dispense Auth. Provider   benzonatate (TESSALON) 200 MG capsule Take 1 capsule (200 mg total) by mouth 3 (three) times daily as needed. 20 capsule Radford Pax, NP      PDMP not reviewed this encounter.   Radford Pax, NP 08/28/23 [phone]

## 2023-08-28 NOTE — Telephone Encounter (Signed)
Pt calls stating that the pharmacy said that tessalon 200mg  was a high dose. Pt calls requesting a smaller dose or to prescribe something else. Cheri Rous, NP is notified and states we can decrease the dose if the patient agrees. The pt is agreeable to send tessalon 100mg . Pharmacy is verified with patient.

## 2023-11-08 ENCOUNTER — Encounter (HOSPITAL_BASED_OUTPATIENT_CLINIC_OR_DEPARTMENT_OTHER): Payer: Self-pay | Admitting: Cardiovascular Disease

## 2023-11-08 ENCOUNTER — Ambulatory Visit (INDEPENDENT_AMBULATORY_CARE_PROVIDER_SITE_OTHER): Payer: Self-pay | Admitting: Cardiovascular Disease

## 2023-11-08 VITALS — BP 132/74 | HR 50 | Ht 67.0 in | Wt 181.3 lb

## 2023-11-08 DIAGNOSIS — E7800 Pure hypercholesterolemia, unspecified: Secondary | ICD-10-CM | POA: Diagnosis not present

## 2023-11-08 DIAGNOSIS — Z5181 Encounter for therapeutic drug level monitoring: Secondary | ICD-10-CM

## 2023-11-08 DIAGNOSIS — I10 Essential (primary) hypertension: Secondary | ICD-10-CM | POA: Diagnosis not present

## 2023-11-08 DIAGNOSIS — Z6828 Body mass index (BMI) 28.0-28.9, adult: Secondary | ICD-10-CM

## 2023-11-08 HISTORY — DX: Morbid (severe) obesity due to excess calories: E66.01

## 2023-11-08 HISTORY — DX: Essential (primary) hypertension: I10

## 2023-11-08 NOTE — Progress Notes (Signed)
 Cardiology Office Note:  .   Date:  11/08/2023  ID:  Paula Lee, DOB 1960-11-16, MRN [id number] PCP: Edda Goo, MD  [facility] Providers Cardiologist:  Kyra Phy, MD    History of Present Illness: .    Paula Lee is a 63 y.o. female with hypertension, hypothyroidism, and noncardiac chest pain here for follow-up.  She was previously a patient of Dr. [name].  She is struggled with labile blood pressure.  She is also been intolerant to multiple medications.  She has not tolerated ARB's or ACE inhibitors due to myalgias.  She was afraid to take amlodipine .  She saw [name], DNP and was started on HCTZ.  She has reported chest pain and had a nuclear stress test 05/2023 that revealed LVEF greater than 65% and no ischemia.  Echocardiogram 03/2023 revealed LVEF 60-65% with mild LVH and normal diastolic function.  She last saw Bynum Cassis 05/2023 and blood pressure was very well-controlled.  She was not taking it consistently.  She was intermittently taking her rosuvastatin due to nausea.  Discussed the use of AI scribe software for clinical note transcription with the patient, who gave verbal consent to proceed.  History of Present Illness Paula Lee experiences stress due to caregiving responsibilities for her mother, who has mobility issues following septic arthritis and a femur fracture. This stress affects her ability to exercise and sleep, as she struggles to find time for herself and is not sleeping well due to the heat in the house.  She has a history of hypertension and was previously on hydrochlorothiazide , which she discontinued due to side effects of leg pain and weakness. She monitors her blood pressure at home, noting readings around 130/80 mmHg, with occasional spikes to 140/80 mmHg during periods of stress. She is not currently on any antihypertensive medication.  Her dietary habits include frequent consumption of fast food and sweetened beverages like  sweet tea and caffeine. She is attempting to reduce these habits and increase her intake of home-cooked meals.  She experiences occasional swelling, which improves with leg elevation and water intake. She also reports morning nausea, which she attributes to possible acid reflux or postnasal drip. She has a history of asthma, which she feels has improved over time.  Her cholesterol levels were noted to be good in February, with an LDL of 55 mg/dL, and she is not currently on rosuvastatin. She wants dietary guidance to help manage her weight and improve her health.  Prior antihypertensives: ARB ACE inhibitor-myalgias Hydrochlorothiazide  - legs hurting  ROS:  As per HPI  Studies Reviewed: .       05/30/23:   LV perfusion is normal. There is no evidence of ischemia. There is no evidence of infarction.   Left ventricular function is normal. Nuclear stress EF: 69%. The left ventricular ejection fraction is hyperdynamic (>65%). End diastolic cavity size is normal.   The study is normal. The study is low risk.   Echo 04/09/23:  1. Left ventricular ejection fraction, by estimation, is 60 to 65%. Left  ventricular ejection fraction by 3D volume is 64 %. The left ventricle has  normal function. The left ventricle has no regional wall motion  abnormalities. There is mild left  ventricular hypertrophy. Left ventricular diastolic parameters were  normal. The average left ventricular global longitudinal strain is -24.3  %. The global longitudinal strain is normal.   2. Right ventricular systolic function is normal. The right ventricular  size is normal. There is  normal pulmonary artery systolic pressure. The  estimated right ventricular systolic pressure is 24.7 mmHg.   3. The mitral valve is grossly normal. Mild mitral valve regurgitation.  No evidence of mitral stenosis.   4. The aortic valve is tricuspid. Aortic valve regurgitation is not  visualized. No aortic stenosis is present.   5. The  inferior vena cava is normal in size with greater than 50%  respiratory variability, suggesting right atrial pressure of 3 mmHg.   Risk Assessment/Calculations:             Physical Exam:   VS:  BP 132/74   Pulse (!) 50   Ht 5\' 7"  (1.702 m)   Wt 181 lb 4.8 oz (82.2 kg)   LMP 12/22/2016   SpO2 100%   BMI 28.40 kg/m  , BMI Body mass index is 28.4 kg/m. GENERAL:  Well appearing HEENT: Pupils equal round and reactive, fundi not visualized, oral mucosa unremarkable NECK:  No jugular venous distention, waveform within normal limits, carotid upstroke brisk and symmetric, no bruits, no thyromegaly LUNGS:  Clear to auscultation bilaterally HEART:  RRR.  PMI not displaced or sustained,S1 and S2 within normal limits, no S3, no S4, no clicks, no rubs, no murmurs ABD:  Flat, positive bowel sounds normal in frequency in pitch, no bruits, no rebound, no guarding, no midline pulsatile mass, no hepatomegaly, no splenomegaly EXT:  2 plus pulses throughout, no edema, no cyanosis no clubbing SKIN:  No rashes no nodules NEURO:  Cranial nerves II through XII grossly intact, motor grossly intact throughout PSYCH:  Cognitively intact, oriented to person place and time   ASSESSMENT AND PLAN: .    Assessment & Plan # Hypertension Blood pressure near target but not optimal. Hydrochlorothiazide  discontinued due to side effects. Emphasized diet and exercise for management. - Provide diet options for weight loss, refer to Healthy Weight and Wellness program. - Encourage regular exercise to manage blood pressure. - Consider coronary calcium score for arterial plaque assessment.  # Mild left ventricular hypertrophy Likely due to long-standing hypertension. Emphasized controlling blood pressure to prevent further cardiac remodeling.  # Hyperlipidemia:  LDL was 55 and patient reports that she was not taking rosuvastatin at the time.  OK to continue holding for now.  Repeat fasting lipids/CMP prior to follow  up in 6 months.  Discussed coronary calcium score for arterial plaque assessment.  # Obesity Contributes to hypertension and health risks. Emphasized dietary changes and exercise. Recommended Mediterranean-style diet. - Provide information on Mediterranean-style diet. - Refer to Healthy Weight and Wellness program for metabolic assessment and dietary guidance. - Encourage increased water intake and reduction of sugary beverages.  Follow-up Plan to reassess progress in six months focusing on lifestyle changes and blood pressure management. - Schedule follow-up appointment in six months to evaluate progress.    Signed, Maudine Sos, MD

## 2023-11-08 NOTE — Patient Instructions (Addendum)
 Medication Instructions:  Your physician recommends that you continue on your current medications as directed. Please refer to the Current Medication list given to you today.  *If you need a refill on your cardiac medications before your next appointment, please call your pharmacy*  Lab Work: FASTING LP/CMET IN 6 MONTHS ABOUT A WEEK PRIOR TO YOUR FOLLOW UP   If you have labs (blood work) drawn today and your tests are completely normal, you will receive your results only by: MyChart Message (if you have MyChart) OR A paper copy in the mail If you have any lab test that is abnormal or we need to change your treatment, we will call you to review the results.  Testing/Procedures: NONE  Follow-Up: At [facility], you and your health needs are our priority.  As part of our continuing mission to provide you with exceptional heart care, our providers are all part of one team.  This team includes your primary Cardiologist (physician) and Advanced Practice Providers or APPs (Physician Assistants and Nurse Practitioners) who all work together to provide you with the care you need, when you need it.  Your next appointment:   6 month(s)  Provider:   Maudine Sos, MD, Slater Duncan, NP, or Neomi Banks, NP   You have been referred to Ambulatory referral to [facility] [place]) Where: [facility] Address: [address] [place] [address] Phone: [phone]   We recommend signing up for the patient portal called "MyChart".  Sign up information is provided on this After Visit Summary.  MyChart is used to connect with patients for Virtual Visits (Telemedicine).  Patients are able to view lab/test results, encounter notes, upcoming appointments, etc.  Non-urgent messages can be sent to your provider as well.   To learn more about what you can do with MyChart, go to [email or web address].   Other Instructions Exercise  recommendations: The American Heart Association recommends 150 minutes of moderate intensity exercise weekly. Try 30 minutes of moderate intensity exercise 4-5 times per week. This could include walking, jogging, or swimming.

## 2023-12-02 ENCOUNTER — Ambulatory Visit: Payer: BC Managed Care – PPO | Admitting: Dermatology

## 2023-12-02 ENCOUNTER — Encounter: Payer: Self-pay | Admitting: Dermatology

## 2023-12-02 VITALS — BP 123/77 | HR 49 | Wt 187.0 lb

## 2023-12-02 DIAGNOSIS — L299 Pruritus, unspecified: Secondary | ICD-10-CM

## 2023-12-02 DIAGNOSIS — L219 Seborrheic dermatitis, unspecified: Secondary | ICD-10-CM | POA: Diagnosis not present

## 2023-12-02 DIAGNOSIS — L209 Atopic dermatitis, unspecified: Secondary | ICD-10-CM

## 2023-12-02 MED ORDER — ZORYVE 0.15 % EX CREA: 1.0000 | TOPICAL_CREAM | Freq: Two times a day (BID) | CUTANEOUS | 6 refills | Status: AC

## 2023-12-02 MED ORDER — CLOBETASOL PROPIONATE 0.05 % EX SOLN: 1.0000 | Freq: Two times a day (BID) | CUTANEOUS | 6 refills | Status: AC

## 2023-12-02 NOTE — Progress Notes (Signed)
   New Patient Visit   Subjective  Paula Lee is a 63 y.o. female who presents for the following: Rash  Patient states she has rash located at the face, chest, lower legs, and feet that she would like to have examined. Patient reports the areas have been there for several years. She reports the areas are bothersome. She states the areas start off as dark then it will become itchy. Patient rates irritation 5 out of 10. Patient reports she has previously been treated for these areas. Patient has previously tried and fialed Clobetasol  ointment, Clotrimazole /betamethasone , [facility] Patient denies Hx of bx. Patient reports family history of similar skin conditions.  The following portions of the chart were reviewed this encounter and updated as appropriate: medications, allergies, medical history  Review of Systems:  No other skin or systemic complaints except as noted in HPI or Assessment and Plan.  Objective  Well appearing patient in no apparent distress; mood and affect are within normal limits.  A focused examination was performed of the following areas: Arms, legs, feet, chest and back  Relevant exam findings are noted in the Assessment and Plan.                      Assessment & Plan   1. Eczema and Pruritus - Assessment: Patient reports itchy rash spots that come and go for the past couple of years, with flares lasting about a week followed by 2-3 weeks of remission before new flares appear in different areas. Flare locations include the side of the foot and toes, with an itch severity of 5/10. Physical examination reveals brown hyperpigmented patches on the left breast, left axilla, left upper arm, bilateral lower extremities, and feet, consistent with eczema. Previous treatments with steroids, Aveeno, and cortisone cream have provided some relief, but the rash recurs. - Plan:    Initiate Zoryve (non-steroid cream) daily until clear, then 2-3 times weekly for maintenance     Prescribe Zoryve through special pharmacy with manufacturer coupon (covered by [employer])    Continue Aveeno for moisturizing    Provide samples of Excedrin Advanced Repair and La Roche-Posay with urea    Patient education:     - Avoid heat exposure     - Wear light cotton clothing     - Use moisturizing body wash    Follow-up in 3 months to evaluate Zoryve effectiveness    Consider Dupixent as next treatment option if Zoryve is ineffective  2. Seborrheic Dermatitis (Scalp) - Assessment: Patient reports tenderness on the scalp, clinically assessed as seborrheic dermatitis. - Plan:    Use clobetasol  solution as needed for itch    Recommend weekly shampooing with 2% zinc medicated shampoo from [employer] ([facility]HS zinc shampoo)    Advise against use of oils on scalp    Return in about 3 months (around 03/03/2024) for Eczema F/U.  I, Jetta Ager, am acting as Neurosurgeon for [employer], DO.  Documentation: I have reviewed the above documentation for accuracy and completeness, and I agree with the above.  Louana Roup, DO

## 2023-12-02 NOTE — Patient Instructions (Addendum)
 Hello Paula Lee,  Thank you for visiting today. Here is a summary of the key instructions:  - Medications:   - Use Zoryve cream daily until rash clears, then 2-3 times a week   - Apply clobetasol  solution on scalp for flare-ups   - Use DHS zinc shampoo (2% zinc) weekly for scalp  - Skin Care:   - Continue using Aveeno for moisturizing   - Use Eucerin Advanced Repair and La Roche-Posay with urea as moisturizers   - Use a moisturizing body wash   - For face, continue using Cervar and Good Molecules products   - Zoryve is safe to use on face  - Lifestyle Changes:   - Avoid heat exposure   - Wear light cotton clothing   - Avoid using oils on scalp  - Follow-up:   - Return in 3 months to check how well Zoryve is working  Please reach out if you have any questions or concerns.  Warm regards,  Dr. Louana Roup Dermatology       Important Information   Due to recent changes in healthcare laws, you may see results of your pathology and/or laboratory studies on MyChart before the doctors have had a chance to review them. We understand that in some cases there may be results that are confusing or concerning to you. Please understand that not all results are received at the same time and often the doctors may need to interpret multiple results in order to provide you with the best plan of care or course of treatment. Therefore, we ask that you please give us  2 business days to thoroughly review all your results before contacting the office for clarification. Should we see a critical lab result, you will be contacted sooner.     If You Need Anything After Your Visit   If you have any questions or concerns for your doctor, please call our main line at [phone]. If no one answers, please leave a voicemail as directed and we will return your call as soon as possible. Messages left after 4 pm will be answered the following business day.    You may also send us  a message via  MyChart. We typically respond to MyChart messages within 1-2 business days.  For prescription refills, please ask your pharmacy to contact our office. Our fax number is [phone].  If you have an urgent issue when the clinic is closed that cannot wait until the next business day, you can page your doctor at the number below.     Please note that while we do our best to be available for urgent issues outside of office hours, we are not available 24/7.    If you have an urgent issue and are unable to reach us , you may choose to seek medical care at your doctor's office, retail clinic, urgent care center, or emergency room.   If you have a medical emergency, please immediately call 911 or go to the emergency department. In the event of inclement weather, please call our main line at [phone] for an update on the status of any delays or closures.  Dermatology Medication Tips: Please keep the boxes that topical medications come in in order to help keep track of the instructions about where and how to use these. Pharmacies typically print the medication instructions only on the boxes and not directly on the medication tubes.   If your medication is too expensive, please contact our office at [phone] or send us  a  message through MyChart.    We are unable to tell what your co-pay for medications will be in advance as this is different depending on your insurance coverage. However, we may be able to find a substitute medication at lower cost or fill out paperwork to get insurance to cover a needed medication.    If a prior authorization is required to get your medication covered by your insurance company, please allow us  1-2 business days to complete this process.   Drug prices often vary depending on where the prescription is filled and some pharmacies may offer cheaper prices.   The website www.goodrx.com contains coupons for medications through different pharmacies. The prices here do  not account for what the cost may be with help from insurance (it may be cheaper with your insurance), but the website can give you the price if you did not use any insurance.  - You can print the associated coupon and take it with your prescription to the pharmacy.  - You may also stop by our office during regular business hours and pick up a GoodRx coupon card.  - If you need your prescription sent electronically to a different pharmacy, notify our office through [facility] or by phone at [phone]

## 2024-02-20 ENCOUNTER — Other Ambulatory Visit: Payer: Self-pay | Admitting: Internal Medicine

## 2024-02-20 DIAGNOSIS — Z1231 Encounter for screening mammogram for malignant neoplasm of breast: Secondary | ICD-10-CM

## 2024-02-21 ENCOUNTER — Ambulatory Visit
Admission: RE | Admit: 2024-02-21 | Discharge: 2024-02-21 | Disposition: A | Discharge status: Home / Self Care | Source: Ambulatory Visit | Attending: Internal Medicine | Admitting: Internal Medicine

## 2024-02-21 DIAGNOSIS — Z1231 Encounter for screening mammogram for malignant neoplasm of breast: Secondary | ICD-10-CM

## 2024-04-01 ENCOUNTER — Ambulatory Visit: Admitting: Dermatology
